# Patient Record
Sex: Female | Born: 1965 | Race: White | Hispanic: No | Marital: Married | Smoking: Never smoker
Health system: Southern US, Community
[De-identification: ages and names within clinical notes are randomized; demographics above are authoritative.]

## PROBLEM LIST (undated history)

## (undated) DIAGNOSIS — G479 Sleep disorder, unspecified: Secondary | ICD-10-CM

## (undated) DIAGNOSIS — H539 Unspecified visual disturbance: Secondary | ICD-10-CM

## (undated) DIAGNOSIS — K219 Gastro-esophageal reflux disease without esophagitis: Secondary | ICD-10-CM

## (undated) DIAGNOSIS — O903 Peripartum cardiomyopathy: Secondary | ICD-10-CM

## (undated) DIAGNOSIS — O1003 Pre-existing essential hypertension complicating the puerperium: Secondary | ICD-10-CM

## (undated) DIAGNOSIS — R002 Palpitations: Secondary | ICD-10-CM

## (undated) DIAGNOSIS — I341 Nonrheumatic mitral (valve) prolapse: Secondary | ICD-10-CM

## (undated) DIAGNOSIS — R569 Unspecified convulsions: Secondary | ICD-10-CM

## (undated) HISTORY — DX: Peripartum cardiomyopathy: O90.3

## (undated) HISTORY — PX: BREAST EXCISIONAL BIOPSY: SUR124

## (undated) HISTORY — DX: Unspecified visual disturbance: H53.9

## (undated) HISTORY — PX: TUBAL LIGATION: SHX77

## (undated) HISTORY — DX: Sleep disorder, unspecified: G47.9

## (undated) HISTORY — DX: Palpitations: R00.2

## (undated) HISTORY — DX: Gastro-esophageal reflux disease without esophagitis: K21.9

## (undated) HISTORY — DX: Pre-existing essential hypertension complicating the puerperium: O10.03

## (undated) HISTORY — DX: Unspecified convulsions: R56.9

## (undated) HISTORY — PX: OTHER SURGICAL HISTORY: SHX169

## (undated) HISTORY — PX: DILATION AND CURETTAGE OF UTERUS: SHX78

---

## 1996-12-10 HISTORY — PX: APPENDECTOMY: SHX54

## 1998-09-15 ENCOUNTER — Ambulatory Visit (HOSPITAL_COMMUNITY): Admission: RE | Admit: 1998-09-15 | Discharge: 1998-09-15 | Payer: Self-pay | Admitting: *Deleted

## 1999-05-05 ENCOUNTER — Inpatient Hospital Stay (HOSPITAL_COMMUNITY): Admission: RE | Admit: 1999-05-05 | Discharge: 1999-05-06 | Payer: Self-pay | Admitting: Gynecology

## 1999-08-31 ENCOUNTER — Other Ambulatory Visit: Admission: RE | Admit: 1999-08-31 | Discharge: 1999-08-31 | Payer: Self-pay | Admitting: Obstetrics and Gynecology

## 1999-12-11 DIAGNOSIS — O903 Peripartum cardiomyopathy: Secondary | ICD-10-CM

## 1999-12-11 DIAGNOSIS — H539 Unspecified visual disturbance: Secondary | ICD-10-CM

## 1999-12-11 HISTORY — DX: Unspecified visual disturbance: H53.9

## 1999-12-11 HISTORY — DX: Peripartum cardiomyopathy: O90.3

## 1999-12-18 ENCOUNTER — Ambulatory Visit (HOSPITAL_COMMUNITY): Admission: RE | Admit: 1999-12-18 | Discharge: 1999-12-18 | Payer: Self-pay | Admitting: *Deleted

## 2000-01-29 ENCOUNTER — Inpatient Hospital Stay (HOSPITAL_COMMUNITY): Admission: AD | Admit: 2000-01-29 | Discharge: 2000-01-29 | Payer: Self-pay | Admitting: Obstetrics and Gynecology

## 2000-01-29 ENCOUNTER — Encounter: Payer: Self-pay | Admitting: Obstetrics and Gynecology

## 2000-03-01 ENCOUNTER — Inpatient Hospital Stay (HOSPITAL_COMMUNITY): Admission: AD | Admit: 2000-03-01 | Discharge: 2000-03-03 | Payer: Self-pay | Admitting: *Deleted

## 2000-03-06 ENCOUNTER — Inpatient Hospital Stay (HOSPITAL_COMMUNITY): Admission: AD | Admit: 2000-03-06 | Discharge: 2000-03-09 | Payer: Self-pay | Admitting: Obstetrics and Gynecology

## 2000-03-06 ENCOUNTER — Encounter: Payer: Self-pay | Admitting: Obstetrics and Gynecology

## 2000-03-06 DIAGNOSIS — R569 Unspecified convulsions: Secondary | ICD-10-CM

## 2000-03-06 HISTORY — DX: Unspecified convulsions: R56.9

## 2000-03-07 ENCOUNTER — Encounter: Payer: Self-pay | Admitting: Obstetrics and Gynecology

## 2000-03-10 ENCOUNTER — Encounter: Payer: Self-pay | Admitting: *Deleted

## 2000-03-10 ENCOUNTER — Inpatient Hospital Stay (HOSPITAL_COMMUNITY): Admission: AD | Admit: 2000-03-10 | Discharge: 2000-03-12 | Payer: Self-pay | Admitting: *Deleted

## 2000-03-11 ENCOUNTER — Encounter: Payer: Self-pay | Admitting: *Deleted

## 2000-03-12 ENCOUNTER — Encounter: Payer: Self-pay | Admitting: Obstetrics and Gynecology

## 2000-03-17 ENCOUNTER — Encounter: Admission: RE | Admit: 2000-03-17 | Discharge: 2000-06-15 | Payer: Self-pay | Admitting: Obstetrics and Gynecology

## 2000-04-15 ENCOUNTER — Other Ambulatory Visit: Admission: RE | Admit: 2000-04-15 | Discharge: 2000-04-15 | Payer: Self-pay | Admitting: Obstetrics and Gynecology

## 2000-06-16 ENCOUNTER — Encounter: Admission: RE | Admit: 2000-06-16 | Discharge: 2000-07-09 | Payer: Self-pay | Admitting: Obstetrics and Gynecology

## 2001-06-05 ENCOUNTER — Other Ambulatory Visit: Admission: RE | Admit: 2001-06-05 | Discharge: 2001-06-05 | Payer: Self-pay | Admitting: Obstetrics and Gynecology

## 2001-11-05 ENCOUNTER — Encounter: Payer: Self-pay | Admitting: Obstetrics and Gynecology

## 2001-11-05 ENCOUNTER — Encounter: Admission: RE | Admit: 2001-11-05 | Discharge: 2001-11-05 | Payer: Self-pay | Admitting: Obstetrics and Gynecology

## 2002-06-24 ENCOUNTER — Other Ambulatory Visit: Admission: RE | Admit: 2002-06-24 | Discharge: 2002-06-24 | Payer: Self-pay | Admitting: Obstetrics and Gynecology

## 2003-06-25 ENCOUNTER — Emergency Department (HOSPITAL_COMMUNITY): Admission: EM | Admit: 2003-06-25 | Discharge: 2003-06-25 | Payer: Self-pay | Admitting: Emergency Medicine

## 2003-07-21 ENCOUNTER — Other Ambulatory Visit: Admission: RE | Admit: 2003-07-21 | Discharge: 2003-07-21 | Payer: Self-pay | Admitting: Obstetrics and Gynecology

## 2004-08-10 ENCOUNTER — Other Ambulatory Visit: Admission: RE | Admit: 2004-08-10 | Discharge: 2004-08-10 | Payer: Self-pay | Admitting: Obstetrics and Gynecology

## 2005-01-30 ENCOUNTER — Encounter: Admission: RE | Admit: 2005-01-30 | Discharge: 2005-01-30 | Payer: Self-pay | Admitting: Obstetrics and Gynecology

## 2005-02-09 ENCOUNTER — Encounter: Admission: RE | Admit: 2005-02-09 | Discharge: 2005-02-09 | Payer: Self-pay | Admitting: Obstetrics and Gynecology

## 2005-04-27 ENCOUNTER — Encounter: Admission: RE | Admit: 2005-04-27 | Discharge: 2005-04-27 | Payer: Self-pay | Admitting: Obstetrics and Gynecology

## 2005-09-20 ENCOUNTER — Other Ambulatory Visit: Admission: RE | Admit: 2005-09-20 | Discharge: 2005-09-20 | Payer: Self-pay | Admitting: Obstetrics and Gynecology

## 2005-10-26 ENCOUNTER — Encounter: Admission: RE | Admit: 2005-10-26 | Discharge: 2005-10-26 | Payer: Self-pay | Admitting: Obstetrics and Gynecology

## 2006-09-30 HISTORY — PX: NM MYOCAR PERF EJECTION FRACTION: HXRAD630

## 2006-09-30 HISTORY — PX: US ECHOCARDIOGRAPHY: HXRAD669

## 2006-11-18 ENCOUNTER — Encounter: Admission: RE | Admit: 2006-11-18 | Discharge: 2006-11-18 | Payer: Self-pay | Admitting: Obstetrics and Gynecology

## 2006-12-04 ENCOUNTER — Encounter: Admission: RE | Admit: 2006-12-04 | Discharge: 2006-12-04 | Payer: Self-pay | Admitting: Obstetrics and Gynecology

## 2007-11-20 ENCOUNTER — Encounter: Admission: RE | Admit: 2007-11-20 | Discharge: 2007-11-20 | Payer: Self-pay | Admitting: Obstetrics and Gynecology

## 2008-11-29 ENCOUNTER — Encounter: Admission: RE | Admit: 2008-11-29 | Discharge: 2008-11-29 | Payer: Self-pay | Admitting: Obstetrics and Gynecology

## 2009-01-28 ENCOUNTER — Encounter: Admission: RE | Admit: 2009-01-28 | Discharge: 2009-01-28 | Payer: Self-pay | Admitting: Obstetrics and Gynecology

## 2009-02-01 ENCOUNTER — Encounter: Admission: RE | Admit: 2009-02-01 | Discharge: 2009-02-01 | Payer: Self-pay | Admitting: Obstetrics and Gynecology

## 2009-02-18 ENCOUNTER — Encounter: Admission: RE | Admit: 2009-02-18 | Discharge: 2009-02-18 | Payer: Self-pay | Admitting: Obstetrics and Gynecology

## 2009-12-05 ENCOUNTER — Encounter: Admission: RE | Admit: 2009-12-05 | Discharge: 2009-12-05 | Payer: Self-pay | Admitting: Obstetrics and Gynecology

## 2010-12-06 ENCOUNTER — Encounter
Admission: RE | Admit: 2010-12-06 | Discharge: 2010-12-06 | Payer: Self-pay | Source: Home / Self Care | Attending: Obstetrics and Gynecology | Admitting: Obstetrics and Gynecology

## 2010-12-14 ENCOUNTER — Encounter
Admission: RE | Admit: 2010-12-14 | Discharge: 2010-12-14 | Payer: Self-pay | Source: Home / Self Care | Attending: Obstetrics and Gynecology | Admitting: Obstetrics and Gynecology

## 2011-04-27 NOTE — Discharge Summary (Signed)
[facility]  Patient:    Kathryn Horton, HEADINGS                     MRN: [id number] Adm. Date:  [id number] Disc. Date: [id number] Attending:  Donne Hazel                           Discharge Summary  HISTORY OF PRESENT ILLNESS:   The patient is a 45 year old, gravida 3, para 3, status post vaginal delivery eight days prior to this admission admitted with a  chief complaint of shortness of breath.  A chest x-ray showed mild pulmonary edema. For further details, please see the written history and physical note.  HOSPITAL COURSE:              The patient was diagnosed with pulmonary edema as  indicated on chest x-ray and mild mitral and pulmonic regurgitation by echocardiogram the week previously.  The patient was watched in the adult intensive care unit and pulmonary edema was treated with Lasix diuresis.  She was seen in  consultation by cardiologist from [facility] and then subsequently by Madaline Savage, M.D.  Madaline Savage, M.D. felt that the patient could be discharged on March 12, 2000, because her lungs were clear. she was discharged home on Verapamil SR 180 mg daily to follow up with Yolanda Bonine, P.A. in two to three weeks and to call with any problems.  The patient was subsequently discharged home and given routine postpartum instructions. DD:  04/26/00 TD:  04/28/00 Job: [id number]

## 2011-04-27 NOTE — Discharge Summary (Signed)
[facility]  Patient:    Kathryn Horton, Kathryn Horton                     MRN: [id number] Adm. Date:  [id number] Disc. Date: [id number] Attending:  Donne Hazel                           Discharge Summary  DISCHARGE DIAGNOSES:          1. Postpartum preeclampsia.                               2. Postpartum pulmonary edema.  HISTORY OF PRESENT ILLNESS:   For summary of the history and physical exam, please see admission H&P for details.                                Briefly, a 45 year old G3 P3 delivered four days prior to admission.  During that time, her blood pressure in the range of 130/80 maximum; platelet count was 102,000 and then 113,000 postpartum.  She presented the evening of admission complaining of right upper quadrant pain, some shortness of breath, occipital headache, and mild cough.  Initial evaluation revealed stable  vital signs, but BP was in the 150s-170s systolic over 98-109 diastolic range.  Lower extremity edema of 1+ was noted.  HOSPITAL COURSE:              On admission, ABGs on room air revealed pH 7.4, O2 61, CO2 was 28.  Chest x-ray showed interstitial edema.  The cardiac silhouette was not enlarged.  Initial impression was pulmonary edema with postpartum preeclampsia. Her urine was negative for protein but SGOT and PT were slightly elevated at 59  and 56, respectively.  She was started on magnesium sulfate, was given Lasix for diuresis.  The following a.m., BP was 140/70 with a 98% saturation on nasal cannula O2.  SGOT was 53, SGPT repeated was stable at 56.  Magnesium level was 5.                                Spiral CT was done along with echocardiogram, showed bilateral pleural effusions, pulmonary edema, no evidence for pulmonary embolus. Cardiac echo showed normal aortic mitral and tricuspid valves, mild mitral regurgitation, normal left ventricular, and no pericardial effusion was noted.   By March 30,  her room air O2 saturations were 98-99%.  Her magnesium sulfate was discontinued along with her oxygen and her Foley catheter.  On the following a.m., March 31, she felt much improved, was breathing without difficulty, and maintaining a good O2 saturation on room air.  Lungs were clear at that point.  Decision was made to proceed with discharge, with follow-up in our office and repeat lab studies within two to three days.  The BP on discharge was 148/86.  OTHER LABORATORY DATA:        CBC on admission - WBC 9.3, hemoglobin 12.4. Urine was negative for protein.  Platelet count 214,000.  DISPOSITION:  MEDICATIONS:                  Patient will resume her prenatal vitamins.  FOLLOW-UP:  Will return to our office in two to three days for follow-up lab studies.  INSTRUCTIONS:                 Was advised to report any shortness of breath, chest discomfort, persistent cough.  Other routine postpartum instructions were reviewed with her.  CONDITION:                    Good.  ACTIVITY:                     Gradually increase. DD:  04/02/00 TD:  04/03/00 Job: [id number]

## 2011-11-12 ENCOUNTER — Other Ambulatory Visit: Payer: Self-pay | Admitting: Obstetrics and Gynecology

## 2011-11-12 DIAGNOSIS — Z1231 Encounter for screening mammogram for malignant neoplasm of breast: Secondary | ICD-10-CM

## 2011-12-10 ENCOUNTER — Ambulatory Visit
Admission: RE | Admit: 2011-12-10 | Discharge: 2011-12-10 | Disposition: A | Discharge status: Home / Self Care | Payer: BC Managed Care – PPO | Source: Ambulatory Visit | Attending: Obstetrics and Gynecology | Admitting: Obstetrics and Gynecology

## 2011-12-10 DIAGNOSIS — Z1231 Encounter for screening mammogram for malignant neoplasm of breast: Secondary | ICD-10-CM

## 2012-06-19 HISTORY — PX: OTHER SURGICAL HISTORY: SHX169

## 2012-06-24 HISTORY — PX: US ECHOCARDIOGRAPHY: HXRAD669

## 2012-06-26 HISTORY — PX: OTHER SURGICAL HISTORY: SHX169

## 2012-11-05 ENCOUNTER — Other Ambulatory Visit: Payer: Self-pay | Admitting: Obstetrics and Gynecology

## 2012-11-05 DIAGNOSIS — Z1231 Encounter for screening mammogram for malignant neoplasm of breast: Secondary | ICD-10-CM

## 2012-12-11 ENCOUNTER — Ambulatory Visit
Admission: RE | Admit: 2012-12-11 | Discharge: 2012-12-11 | Disposition: A | Discharge status: Home / Self Care | Payer: BC Managed Care – PPO | Source: Ambulatory Visit | Attending: Obstetrics and Gynecology | Admitting: Obstetrics and Gynecology

## 2012-12-11 DIAGNOSIS — Z1231 Encounter for screening mammogram for malignant neoplasm of breast: Secondary | ICD-10-CM

## 2013-06-17 ENCOUNTER — Other Ambulatory Visit: Payer: Self-pay | Admitting: Obstetrics and Gynecology

## 2013-11-12 ENCOUNTER — Other Ambulatory Visit: Payer: Self-pay

## 2013-11-12 DIAGNOSIS — Z1231 Encounter for screening mammogram for malignant neoplasm of breast: Secondary | ICD-10-CM

## 2013-12-15 ENCOUNTER — Ambulatory Visit
Admission: RE | Admit: 2013-12-15 | Discharge: 2013-12-15 | Disposition: A | Discharge status: Home / Self Care | Payer: BC Managed Care – PPO | Source: Ambulatory Visit

## 2013-12-15 DIAGNOSIS — Z1231 Encounter for screening mammogram for malignant neoplasm of breast: Secondary | ICD-10-CM

## 2014-01-19 ENCOUNTER — Other Ambulatory Visit: Payer: Self-pay | Admitting: Dermatology

## 2014-02-11 ENCOUNTER — Ambulatory Visit (INDEPENDENT_AMBULATORY_CARE_PROVIDER_SITE_OTHER): Payer: BC Managed Care – PPO

## 2014-02-11 ENCOUNTER — Ambulatory Visit (INDEPENDENT_AMBULATORY_CARE_PROVIDER_SITE_OTHER): Payer: BC Managed Care – PPO | Admitting: Family Medicine

## 2014-02-11 VITALS — BP 116/74 | HR 64 | Temp 98.9°F | Wt 131.0 lb

## 2014-02-11 DIAGNOSIS — M25571 Pain in right ankle and joints of right foot: Secondary | ICD-10-CM

## 2014-02-11 DIAGNOSIS — M25579 Pain in unspecified ankle and joints of unspecified foot: Secondary | ICD-10-CM

## 2014-02-11 MED ORDER — NAPROXEN 500 MG PO TABS: 500.0000 mg | ORAL_TABLET | Freq: Two times a day (BID) | ORAL | Status: DC

## 2014-02-11 NOTE — Progress Notes (Signed)
   Subjective:    Patient ID: Kathryn Horton, female    DOB: 12/13/1965, 48 y.o.   MRN: [id number]  HPI This 48 y.o. female presents for evaluation of right ankle pain and discomfort.  She is a runner And last ran a week and a half ago.  She started having pain on her right lateral malleolus 2 days ago.  She has not had any injury that she can recall.   Review of Systems C/o right ankle pain No chest pain, SOB, HA, dizziness, vision change, N/V, diarrhea, constipation, dysuria, urinary urgency or frequency, myalgias, arthralgias or rash.     Objective:   Physical Exam  Vital signs noted  Well developed well nourished female.  HEENT - Head atraumatic Normocephalic Respiratory - Lungs CTA bilateral Cardiac - RRR S1 and S2 without murmur MS - Swelling, erythema, and tenderness right lateral malleolus.          Gait WNL      Assessment & Plan:

## 2014-02-23 ENCOUNTER — Other Ambulatory Visit: Payer: Self-pay

## 2014-02-23 MED ORDER — PANTOPRAZOLE SODIUM 40 MG PO TBEC: 40.0000 mg | DELAYED_RELEASE_TABLET | Freq: Every day | ORAL | Status: DC

## 2014-02-23 NOTE — Telephone Encounter (Signed)
Rx was sent to pharmacy electronically. 

## 2014-06-02 ENCOUNTER — Other Ambulatory Visit: Payer: Self-pay | Admitting: Cardiovascular Disease

## 2014-06-09 ENCOUNTER — Other Ambulatory Visit: Payer: Self-pay | Admitting: *Deleted

## 2014-06-10 NOTE — Telephone Encounter (Signed)
Rx was sent to pharmacy electronically. Patient needs office visit for future refills. Last office visit - 04/14/13 with Dr Jonette Eva Weintraub.

## 2014-06-14 ENCOUNTER — Other Ambulatory Visit: Payer: Self-pay | Admitting: Cardiovascular Disease

## 2014-06-14 NOTE — Telephone Encounter (Signed)
Refilled 06/10/14 - #15 with no refills. Patient needs appointment.

## 2014-06-30 ENCOUNTER — Other Ambulatory Visit: Payer: Self-pay | Admitting: Cardiovascular Disease

## 2014-06-30 NOTE — Telephone Encounter (Signed)
Patient must keep appointment on 08/19/14 with Dr Herbie BaltimoreHarding for ANY future refills.

## 2014-07-12 ENCOUNTER — Telehealth: Payer: Self-pay | Admitting: *Deleted

## 2014-07-12 ENCOUNTER — Telehealth: Payer: Self-pay | Admitting: Cardiology

## 2014-07-12 NOTE — Telephone Encounter (Signed)
Returning your call. °

## 2014-07-12 NOTE — Telephone Encounter (Signed)
Patient P.A. Needs to be filed out for Protonix. Omeprazole did not work at all according to patient. I will fill out P.A. Now .

## 2014-07-12 NOTE — Telephone Encounter (Signed)
Questions regarding Prior Auth for Protonix. Ask patient to please call me back to get clarification. Patient was put on Nexium 11/13 due to insurance not covering, then [facility]

## 2014-07-13 ENCOUNTER — Telehealth: Payer: Self-pay | Admitting: *Deleted

## 2014-07-13 NOTE — Telephone Encounter (Signed)
P.A. sent in for Protonix on 07/12/14. Approval e-mail received this morning

## 2014-08-11 ENCOUNTER — Other Ambulatory Visit: Payer: Self-pay | Admitting: Cardiovascular Disease

## 2014-08-11 NOTE — Telephone Encounter (Signed)
Rx was sent to pharmacy electronically. OV 08/19/14

## 2014-08-19 ENCOUNTER — Ambulatory Visit: Payer: BC Managed Care – PPO | Admitting: Cardiology

## 2014-09-17 ENCOUNTER — Other Ambulatory Visit: Payer: Self-pay | Admitting: Cardiovascular Disease

## 2014-09-20 NOTE — Telephone Encounter (Signed)
Rx was sent to pharmacy electronically. OV 10/19 

## 2014-09-23 ENCOUNTER — Encounter: Payer: Self-pay | Admitting: *Deleted

## 2014-09-27 ENCOUNTER — Ambulatory Visit (INDEPENDENT_AMBULATORY_CARE_PROVIDER_SITE_OTHER): Payer: BC Managed Care – PPO | Admitting: Cardiology

## 2014-09-27 ENCOUNTER — Encounter: Payer: Self-pay | Admitting: Cardiology

## 2014-09-27 VITALS — BP 140/98 | HR 56 | Ht 64.0 in | Wt 130.6 lb

## 2014-09-27 DIAGNOSIS — O903 Peripartum cardiomyopathy: Secondary | ICD-10-CM

## 2014-09-27 DIAGNOSIS — R002 Palpitations: Secondary | ICD-10-CM | POA: Insufficient documentation

## 2014-09-27 DIAGNOSIS — I10 Essential (primary) hypertension: Secondary | ICD-10-CM | POA: Insufficient documentation

## 2014-09-27 DIAGNOSIS — R030 Elevated blood-pressure reading, without diagnosis of hypertension: Secondary | ICD-10-CM

## 2014-09-27 DIAGNOSIS — O1003 Pre-existing essential hypertension complicating the puerperium: Secondary | ICD-10-CM

## 2014-09-27 NOTE — Progress Notes (Signed)
PCP: Rudi HeapMOORE, DONALD, MD  Clinic Note: Chief Complaint  Patient presents with  . Follow-up    5163yr; no complaints other than having vision issues on occasion.    HPI: Kathryn Horton is a 48 y.o. female with a PMH below who presents today for delayed one year followup. She is a former patient of Dr. Susa Griffinsichard Weintraub last seen in May of 2014. She has a history of postpartum hypertension/eclampsia postpartum cardiomyopathy when the birth of her now 48 year old son. Immediately post partum she had mitral regurgitation that has gradually improved. Her most recent echocardiographic evaluations have shown recovery of left ventricular function as well as mitral valve function. She's also had some palpitations have improved over the last few years as well. As she is a new patient to me, personally reviewed her echocardiogram and Myoview as well as other medical records.  Past Medical History  Diagnosis Date  . Palpitations 06/26/2012-07/09/2012    WORE MOINTOR- SHOWED NORMAL  SINUS - SINUS TACHYCARDIA 78 TO 143  . Hypertension in pregnancy, essential, postpartum condition     pregnancy induced - Post Partum Ecclampsia  . Seizures 03/06/2000    on first visit to hospital after baby  . Visual disorder 2001    during hih blood pressure with pregnancy  . GERD (gastroesophageal reflux disease)   . Sleep difficulties   . Postpartum cardiomyopathy 2001    Related to Post-partum Ecclampsia; Most Recent Echo EF greater than 55%, normal LV size and function. Mild MR with no MVP (previouly had functional MR)    Prior Cardiac Evaluation and Past Surgical History: Past Surgical History  Procedure Laterality Date  . [place] echocardiography  06/24/2012    EF =>55%, NORMAL  LEFT VENTRICULAR WALL, NORMAL LEFT ATRIAL SIZE,mild mitral regurg, mild tricuspid regurg.  . [place] echocardiography  09/30/2006    EF=>55% LEFT VENTRICULAR WALL NORMAL, mild mitral regurgitation, mild tricuspid regurg.  . Treadmill exercise  stress test  06/26/2012    negative ti adequate Bruce protocol- exercised for 11 min reaching 15.4 METS ACHEIVING APEAK HRAET RATE OF 185 REPRESENTING A 105% OF HER AGE-PREDICTED MAXIUM HEART RATE, RECVERED IN 6 MINUTES WITH RESTING HEART RATE OF 97  . Nm myocar perf ejection fraction  09/30/2006    EF 81% , GLOBAL LEFT VENTRICULAR SYSTOLIC FX NORMAL; EXERCISE CAPACITY 8 METS.  . Right lower venous doppler  06/19/2012    NO EVIDENCE OF THROMBUS  . Bilateral lower arterial doppler  07/11/213    NORMAL ABI'S 1.1  . Appendectomy  1998    Interval History: He presented today very stable without any major complaints. She is in a little rushed getting her into her blood pressure was low but higher when she first came in. Her blood pressure my recheck was 130/90. Denies any chest tightness/pressure or dyspnea with rest or exertion. No PND, orthopnea or edema. No rapid or irregular heartbeat/palpitations in the past year or so she gets stressed. She denies any syncope/near syncope, orNo TIA/amaurosis fugax symptoms. No claudication.  She last saw Dr. Alanda AmassWeintraub she was having some sharp burning sensations in her chest that he started her on narcotics 4. Since then she has been doing great no further symptoms. ROS: A comprehensive was performed. Review of Systems  HENT: Negative for congestion and nosebleeds.   Respiratory: Negative for cough, shortness of breath and wheezing.   Cardiovascular: Negative for claudication.  Gastrointestinal: Negative for constipation, blood in stool and melena.  Genitourinary: Negative for hematuria.  Musculoskeletal:  Negative for joint pain and myalgias.  Neurological: Negative for dizziness, sensory change, speech change, focal weakness, seizures and loss of consciousness.  Endo/Heme/Allergies: Does not bruise/bleed easily.  Psychiatric/Behavioral: Negative for depression. The patient is not nervous/anxious.   All other systems reviewed and are  negative.   Current Outpatient Prescriptions on File Prior to Visit  Medication Sig Dispense Refill  . pantoprazole (PROTONIX) 40 MG tablet Take 1 tablet (40 mg total) by mouth daily. Appointment 10/19.  30 tablet  0  . Progesterone Micronized (PROGESTERONE PO) Take by mouth. Every 3 months ,prn      . zolpidem (AMBIEN) 5 MG tablet Take 5 mg by mouth at bedtime as needed for sleep.       No current facility-administered medications on file prior to visit.   No Known Allergies  History   Social History Narrative   She is a married (remarried) mother of 3 children 2 from her first marriage they're aged 48 and 6922. She has a 48 year old son from her second marriage. 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   Is very active and works full-time As well as exercises.   She never smoked. Social alcohol.   family history includes Arrhythmia in her mother; Hypertension in her maternal grandmother and mother; Stroke in her maternal grandmother.  Wt Readings from Last 3 Encounters:  09/27/14 130 lb 9.6 oz (59.24 kg)  02/11/14 131 lb (59.421 kg)    PHYSICAL EXAM BP 140/98  Pulse 56  Ht 5\' 4"  (1.626 m)  Wt 130 lb 9.6 oz (59.24 kg)  BMI 22.41 kg/m2 General appearance: alert, cooperative, appears stated age, no distress and otherwise healthy-appearing. Neck: no adenopathy, no carotid bruit and no JVD Lungs: clear to auscultation bilaterally, normal percussion bilaterally and non-labored Heart: regular rate and rhythm, S1 &S2 normal, very soft ~1/6 and apex and, no click, rub or gallop; nondisplaced PMI Abdomen: soft, non-tender; bowel sounds normal; no masses,  no organomegaly; Extremities: extremities normal, atraumatic, no cyanosis, or edema Pulses: 2+ and symmetric; Skin: normal and mobility and turgor normal  Neurologic: Mental status: Alert, oriented, thought content appropriate Cranial nerves: normal  (II-XII grossly intact)    Adult ECG Report  Rate: 56 ;  Rhythm: sinus bradycardia  Narrative Interpretation: Otherwise normal EKG  Recent Labs:    None available  ASSESSMENT / PLAN: 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 No heart failure symptoms. She is no longer on any medications has been stable for several years. Continued monitoring annually.  Heart palpitations Resolved. Not on medications.  Borderline hypertension Her blood pressure was mildly elevated upon arrival today, however on my recheck it was pretty normal. She was just a little stressed having rushed in. For now to continue monitor as I think this is probably just anxiety related mild elevation of blood pressures. With no further heart failure symptoms and improved EF she does not require cardiomyopathy related medications.     Orders Placed This Encounter  Procedures  . EKG 12-Lead   No orders of the defined types were placed in this encounter.    Followup: One year   Marykay LexHARDING,DAVID W, M.D., M.S. Interventional Cardiologist   Pager # [phone]

## 2014-09-27 NOTE — Assessment & Plan Note (Signed)
Most recent echocardiogram in 2013 showed essentially normal EF with minimal MR. No heart failure symptoms. She is no longer on any medications has been stable for several years. Continued monitoring annually.

## 2014-09-27 NOTE — Assessment & Plan Note (Signed)
Resolved. Not on medications.

## 2014-09-27 NOTE — Assessment & Plan Note (Signed)
Her blood pressure was mildly elevated upon arrival today, however on my recheck it was pretty normal. She was just a little stressed having rushed in. For now to continue monitor as I think this is probably just anxiety related mild elevation of blood pressures. With no further heart failure symptoms and improved EF she does not require cardiomyopathy related medications.

## 2014-09-27 NOTE — Patient Instructions (Signed)
Your physician recommends that you schedule a follow-up appointment in: one year with Dr. Harding  

## 2014-10-11 ENCOUNTER — Telehealth: Payer: Self-pay | Admitting: Cardiology

## 2014-10-11 ENCOUNTER — Telehealth: Payer: Self-pay | Admitting: *Deleted

## 2014-10-11 NOTE — Telephone Encounter (Signed)
Please call,blood pressure is extremely high for her. It is 151/90, this was just a few minutes ago.Since she last saw the doctor its been running high more than usual.

## 2014-10-11 NOTE — Telephone Encounter (Signed)
Wrong doctor °

## 2014-10-11 NOTE — Telephone Encounter (Signed)
LMTCB

## 2014-10-11 NOTE — Telephone Encounter (Signed)
Lets try low dose ACE-I - Lisinopril ~2.5 mg daily.  Diastolic pressures are a bit high.  Marykay LexHARDING,Tamarius Rosenfield W, MD

## 2014-10-11 NOTE — Telephone Encounter (Signed)
Pt states she checked her BP these days because she had a headache and just didn't feel like herself. These are the only days she has checked her BP since office visit with Dr Herbie BaltimoreHarding 09/27/14.  Her heart rate today was 85. She states in the past her BP has been in the 117/70 range.  I will forward to Dr Herbie BaltimoreHarding for review and recommendations.

## 2014-10-11 NOTE — Telephone Encounter (Signed)
BP readings since office visit with Dr Herbie BaltimoreHarding: 09/28/14 136/96 10/09/14 135/98 10/10/14 135/85 10/11/14 151/90

## 2014-10-12 MED ORDER — LISINOPRIL 2.5 MG PO TABS: 2.5000 mg | ORAL_TABLET | Freq: Every day | ORAL | Status: DC

## 2014-10-12 NOTE — Telephone Encounter (Signed)
Left message to call back  

## 2014-10-12 NOTE — Telephone Encounter (Signed)
Spoke to patient  instruction given lisinopril 2.5 mg daily  patient verbalized understanding. Sent to pharmacy  #30 x 6

## 2014-10-26 ENCOUNTER — Other Ambulatory Visit: Payer: Self-pay | Admitting: Cardiology

## 2014-10-26 NOTE — Telephone Encounter (Signed)
Rx was sent to pharmacy electronically. 

## 2014-11-15 ENCOUNTER — Other Ambulatory Visit: Payer: Self-pay

## 2014-11-15 DIAGNOSIS — Z1231 Encounter for screening mammogram for malignant neoplasm of breast: Secondary | ICD-10-CM

## 2014-12-17 ENCOUNTER — Other Ambulatory Visit: Payer: Self-pay

## 2014-12-17 ENCOUNTER — Ambulatory Visit
Admission: RE | Admit: 2014-12-17 | Discharge: 2014-12-17 | Disposition: A | Discharge status: Home / Self Care | Payer: BC Managed Care – PPO | Source: Ambulatory Visit

## 2014-12-17 ENCOUNTER — Ambulatory Visit: Payer: BC Managed Care – PPO

## 2014-12-17 DIAGNOSIS — Z1231 Encounter for screening mammogram for malignant neoplasm of breast: Secondary | ICD-10-CM

## 2014-12-20 ENCOUNTER — Other Ambulatory Visit: Payer: Self-pay | Admitting: Obstetrics and Gynecology

## 2014-12-20 DIAGNOSIS — R928 Other abnormal and inconclusive findings on diagnostic imaging of breast: Secondary | ICD-10-CM

## 2014-12-24 ENCOUNTER — Ambulatory Visit
Admission: RE | Admit: 2014-12-24 | Discharge: 2014-12-24 | Disposition: A | Discharge status: Home / Self Care | Payer: BC Managed Care – PPO | Source: Ambulatory Visit | Attending: Obstetrics and Gynecology | Admitting: Obstetrics and Gynecology

## 2014-12-24 ENCOUNTER — Other Ambulatory Visit: Payer: Self-pay | Admitting: Obstetrics and Gynecology

## 2014-12-24 DIAGNOSIS — R928 Other abnormal and inconclusive findings on diagnostic imaging of breast: Secondary | ICD-10-CM

## 2014-12-24 DIAGNOSIS — N6320 Unspecified lump in the left breast, unspecified quadrant: Secondary | ICD-10-CM

## 2015-03-17 ENCOUNTER — Ambulatory Visit (INDEPENDENT_AMBULATORY_CARE_PROVIDER_SITE_OTHER): Payer: BC Managed Care – PPO | Admitting: Physician Assistant

## 2015-03-17 ENCOUNTER — Encounter: Payer: Self-pay | Admitting: Physician Assistant

## 2015-03-17 VITALS — BP 110/68 | HR 69 | Temp 97.3°F | Ht 64.0 in | Wt 133.0 lb

## 2015-03-17 DIAGNOSIS — Z021 Encounter for pre-employment examination: Secondary | ICD-10-CM | POA: Diagnosis not present

## 2015-03-17 NOTE — Progress Notes (Signed)
   Subjective:    Patient ID: Kathryn Horton, female    DOB: 07/13/1966, 49 y.o.   MRN: [id number]  HPI 49 y/o female presents for pre-employment physical. Comorbidities include borderline htn and insomnia.     Review of Systems  All other systems reviewed and are negative.      Objective:   Physical Exam  Constitutional: She is oriented to person, place, and time.  Cardiovascular: Normal rate, regular rhythm and normal heart sounds.  Exam reveals no gallop and no friction rub.   No murmur heard. Pulmonary/Chest: Effort normal and breath sounds normal. No respiratory distress. She has no wheezes. She has no rales.  Musculoskeletal: Normal range of motion. She exhibits no edema or tenderness.  Neurological: She is alert and oriented to person, place, and time. Coordination normal.  Psychiatric: She has a normal mood and affect. Her behavior is normal. Judgment and thought content normal.  Nursing note and vitals reviewed.         Assessment & Plan:  1. Wellness pre employment physical: WNL, able to participate in normal activities with no restrictions

## 2015-03-29 ENCOUNTER — Ambulatory Visit (INDEPENDENT_AMBULATORY_CARE_PROVIDER_SITE_OTHER): Payer: BC Managed Care – PPO | Admitting: *Deleted

## 2015-03-29 DIAGNOSIS — Z111 Encounter for screening for respiratory tuberculosis: Secondary | ICD-10-CM

## 2015-03-29 NOTE — Patient Instructions (Signed)

## 2015-03-29 NOTE — Progress Notes (Signed)
TB skin test placed on left forearm and patient tolerated well. Patient will return on Thursday to have ppd skin test read.

## 2015-03-31 ENCOUNTER — Ambulatory Visit: Payer: BC Managed Care – PPO | Admitting: *Deleted

## 2015-03-31 LAB — TB SKIN TEST
Induration: 3 mm
TB SKIN TEST: NEGATIVE

## 2015-04-01 NOTE — Progress Notes (Signed)
Pt came in today to have PPD read. PPD wnl.

## 2015-05-26 ENCOUNTER — Other Ambulatory Visit: Payer: Self-pay | Admitting: Cardiology

## 2015-05-26 NOTE — Telephone Encounter (Signed)
Rx(s) sent to pharmacy electronically.  

## 2015-05-27 ENCOUNTER — Other Ambulatory Visit: Payer: Self-pay | Admitting: Obstetrics and Gynecology

## 2015-05-30 LAB — CYTOLOGY - PAP

## 2015-07-20 ENCOUNTER — Telehealth: Payer: Self-pay | Admitting: Cardiology

## 2015-07-20 NOTE — Telephone Encounter (Signed)
  Kathryn Horton is calling about a pre-cert for her Protonix , Her insurance company is requiring that her medication gets re certify every 90 days and the pharmacy states that they have faxed over the forms for the pre -cert        Will route to Burt Knack RN to see if she has the forms sent over by pharmacy for re-cert for Protonix

## 2015-07-20 NOTE — Telephone Encounter (Signed)
Kathryn Horton is calling about a pre-cert for her Protonix , Her insurance company is requiring that her medication gets re certify every 90 days and the pharmacy states that they have faxed over the forms for the pre -cert . Please call   Routed to K. Alvstad Pharmacist.  Returned call to patient.  VM that I will call her back when I hear back from pharmacist

## 2015-07-20 NOTE — Telephone Encounter (Signed)
Kathryn Horton is calling about a pre-cert for her Protonix , Her insurance company is requiring that her medication gets re certify every 90 days and the pharmacy states that they have faxed over the forms for the pre -cert . Please call  Thanks

## 2015-07-20 NOTE — Telephone Encounter (Signed)
This should go to the MDs nurse, I don't do prior auths/pre-certs

## 2015-08-22 ENCOUNTER — Encounter: Payer: Self-pay | Admitting: Family Medicine

## 2015-08-22 ENCOUNTER — Ambulatory Visit (INDEPENDENT_AMBULATORY_CARE_PROVIDER_SITE_OTHER): Payer: BC Managed Care – PPO | Admitting: Family Medicine

## 2015-08-22 VITALS — BP 119/80 | HR 71 | Temp 99.1°F | Ht 64.0 in | Wt 130.0 lb

## 2015-08-22 DIAGNOSIS — J208 Acute bronchitis due to other specified organisms: Secondary | ICD-10-CM | POA: Diagnosis not present

## 2015-08-22 NOTE — Progress Notes (Signed)
Subjective:  Patient ID: Kathryn Horton, female    DOB: [birth year]  Age: 49 y.o. MRN: [id number]  CC: URI   HPI Kathryn Horton presents for 4 days of intractable cough. It is nonproductive. It is not associated by shortness of breath or fever. There has been just a slight bit of swelling in the hands so that she cannot put on her wedding rings. 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 She has had severe episodes of heart failure in the past but that has been quite as sent for recent history. Today she is concerned that she may be having some return of her heart symptoms. She has some chest pain that she is says is at the level of her diaphragm up under the breasts. She does not have any central or precordial chest pain. The pain is a crampy ache. There has not been any upper respiratory congestion sore throat drainage or earache. Appetite is good activity level remains normal.  History Kathryn Horton has a past medical history of Palpitations (06/26/2012-07/09/2012); Hypertension in pregnancy, essential, postpartum condition; Seizures (03/06/2000); Visual disorder (2001); GERD (gastroesophageal reflux disease); Sleep difficulties; and Postpartum cardiomyopathy (2001).   She has past surgical history that includes US ECHOCARDIOGRAPHY (06/24/2012); US ECHOCARDIOGRAPHY (09/30/2006); TREADMILL EXERCISE STRESS TEST (06/26/2012); NM MYOCAR PERF EJECTION FRACTION (09/30/2006); RIGHT LOWER VENOUS DOPPLER (06/19/2012); BILATERAL LOWER ARTERIAL DOPPLER (07/11/213); and Appendectomy (1998).   Her family history includes Arrhythmia in her mother; Hypertension in her maternal grandmother and mother; Stroke in her maternal grandmother.She reports that she has never smoked. She does not have any smokeless tobacco history on file. She reports that she drinks alcohol. She reports that she does not use illicit drugs.  Outpatient Prescriptions Prior to Visit  Medication Sig Dispense Refill   . lisinopril (PRINIVIL,ZESTRIL) 2.5 MG tablet TAKE ONE TABLET BY MOUTH ONCE DAILY 30 tablet 6  . pantoprazole (PROTONIX) 40 MG tablet TAKE ONE TABLET BY MOUTH ONCE DAILY 30 tablet 10  . zolpidem (AMBIEN) 10 MG tablet     . Progesterone Micronized (PROGESTERONE PO) Take by mouth. Every 3 months ,prn     No facility-administered medications prior to visit.    ROS Review of Systems  Constitutional: Negative for fever, chills, diaphoresis, appetite change, fatigue and unexpected weight change.  HENT: Negative for congestion, ear pain, hearing loss, postnasal drip, rhinorrhea, sneezing, sore throat and trouble swallowing.   Eyes: Negative for pain.  Respiratory: Positive for cough. Negative for chest tightness and shortness of breath.   Cardiovascular: Negative for chest pain and palpitations.  Gastrointestinal: Negative for nausea, vomiting, abdominal pain, diarrhea and constipation.  Genitourinary: Negative for dysuria, frequency and menstrual problem.  Musculoskeletal: Negative for joint swelling and arthralgias.  Skin: Negative for rash.  Neurological: Negative for dizziness, weakness, numbness and headaches.  Psychiatric/Behavioral: Negative for dysphoric mood and agitation.    Objective:  BP 119/80 mmHg  Pulse 71  Temp(Src) 99.1 F (37.3 C) (Oral)  Ht 5\' 4"  (1.626 m)  Wt 130 lb (58.968 kg)  BMI 22.30 kg/m2  BP Readings from Last 3 Encounters:  08/22/15 119/80  03/17/15 110/68  09/27/14 140/98    Wt Readings from Last 3 Encounters:  08/22/15 130 lb (58.968 kg)  03/17/15 133 lb (60.328 kg)  09/27/14 130 lb 9.6 oz (59.24 kg)     Physical Exam  Constitutional: She is oriented to person, place, and time. She appears well-developed and well-nourished. No distress.  HENT:  Head:  Normocephalic and atraumatic.  Eyes: Conjunctivae are normal. Pupils are equal, round, and reactive to light.  Neck: Normal range of motion. Neck supple. No thyromegaly present.  Cardiovascular:  Normal rate, regular rhythm and normal heart sounds.   No murmur heard. Pulmonary/Chest: Effort normal and breath sounds normal. No respiratory distress. She has no wheezes. She has no rales.  Abdominal: Soft. Bowel sounds are normal. She exhibits no distension. There is no tenderness.  Musculoskeletal: Normal range of motion.  Lymphadenopathy:    She has no cervical adenopathy.  Neurological: She is alert and oriented to person, place, and time.  Skin: Skin is warm and dry.  Psychiatric: She has a normal mood and affect. Her behavior is normal. Judgment and thought content normal.    No results found for: HGBA1C  No results found for: WBC, HGB, HCT, PLT, GLUCOSE, CHOL, TRIG, HDL, LDLDIRECT, LDLCALC, ALT, AST, NA, K, CL, CREATININE, BUN, CO2, TSH, PSA, INR, GLUF, HGBA1C, MICROALBUR  Mm Digital Diagnostic Unilat L  12/24/2014   CLINICAL DATA:  Patient presents for additional views of the left breast as followup to a recent screening exam to evaluate possible distortion over the central mid to lower breast. When patient presents today, she also describes a new palpable abnormality over the 12 o'clock position of the left breast over the past week. The patient had a prior benign excisional biopsy over the inner lower left breast 1997.  EXAM: DIGITAL DIAGNOSTIC  left MAMMOGRAM  ULTRASOUND left BREAST  COMPARISON:  Previous exams.  ACR Breast Density Category c: The breast tissue is heterogeneously dense, which may obscure small masses.  FINDINGS: Spot compression images demonstrate no focal distortion over the central mid to lower left breast. Spot tangential image over patient's palpable abnormality at the 12 o'clock position demonstrates no focal abnormality.  On physical exam, I palpate no focal abnormality over the 12 o'clock position of the left breast.  Ultrasound is performed, showing no focal abnormality over the 12 o'clock position of the left breast to correspond to patient's palpable  abnormality.  IMPRESSION: No focal distortion over the lower central left breast. No focal abnormality to account for patient's palpable abnormality over the 12 o'clock position of the left breast.  RECOMMENDATION: Recommend continued management of patient's palpable abnormality on a clinical basis. Also recommend continued annual bilateral screening mammographic followup.  I have discussed the findings and recommendations with the patient. Results were also provided in writing at the conclusion of the visit. If applicable, a reminder letter will be sent to the patient regarding the next appointment.  BI-RADS CATEGORY  1: Negative.   Electronically Signed   By: Elberta Fortis M.D.   On: 12/24/2014 12:16   US Breast Ltd Uni Left Inc Axilla  12/24/2014   CLINICAL DATA:  Patient presents for additional views of the left breast as followup to a recent screening exam to evaluate possible distortion over the central mid to lower breast. When patient presents today, she also describes a new palpable abnormality over the 12 o'clock position of the left breast over the past week. The patient had a prior benign excisional biopsy over the inner lower left breast 1997.  EXAM: DIGITAL DIAGNOSTIC  left MAMMOGRAM  ULTRASOUND left BREAST  COMPARISON:  Previous exams.  ACR Breast Density Category c: The breast tissue is heterogeneously dense, which may obscure small masses.  FINDINGS: Spot compression images demonstrate no focal distortion over the central mid to lower left breast. Spot tangential image over patient's  palpable abnormality at the 12 o'clock position demonstrates no focal abnormality.  On physical exam, I palpate no focal abnormality over the 12 o'clock position of the left breast.  Ultrasound is performed, showing no focal abnormality over the 12 o'clock position of the left breast to correspond to patient's palpable abnormality.  IMPRESSION: No focal distortion over the lower central left breast. No focal  abnormality to account for patient's palpable abnormality over the 12 o'clock position of the left breast.  RECOMMENDATION: Recommend continued management of patient's palpable abnormality on a clinical basis. Also recommend continued annual bilateral screening mammographic followup.  I have discussed the findings and recommendations with the patient. Results were also provided in writing at the conclusion of the visit. If applicable, a reminder letter will be sent to the patient regarding the next appointment.  BI-RADS CATEGORY  1: Negative.   Electronically Signed   By: Elberta Fortis M.D.   On: 12/24/2014 12:16    Assessment & Plan:   Josaphine was seen today for uri.  Diagnoses and all orders for this visit:  Acute viral bronchitis  I have discontinued Kathryn Horton's Progesterone Micronized (PROGESTERONE PO). I am also having her maintain her pantoprazole, zolpidem, and lisinopril.  No orders of the defined types were placed in this encounter.   Patient will monitor the cough. She does not want medication if it can be avoided. She will follow-up if she become short of breath, the cough becomes productive, fever develops or cardiac symptoms such as chest pain lassitude and edema occur.  Follow-up: Return if symptoms worsen or fail to improve.  Mechele Claude, M.D.

## 2015-08-22 NOTE — Addendum Note (Signed)
Addended by: Mechele Claude on: 08/22/2015 07:38 PM   Modules accepted: Kipp Brood

## 2015-09-14 ENCOUNTER — Telehealth: Payer: Self-pay | Admitting: Cardiology

## 2015-09-14 NOTE — Telephone Encounter (Signed)
Pt called in stating that she still has not received her Pantoprazole and she isn't sure what the issue is. She called in back on 8/10 about a pre cert/ prior authorization but I'm sure if it was addressed by Dr. Herbie Baltimore. Please f/u with this pt and help her with this  Thanks

## 2015-09-19 NOTE — Telephone Encounter (Signed)
Has this been taken care of?

## 2015-09-23 NOTE — Telephone Encounter (Signed)
Can I close this encounter?

## 2015-09-26 NOTE — Telephone Encounter (Signed)
Can this encounter be closed?

## 2015-09-29 ENCOUNTER — Encounter: Payer: Self-pay | Admitting: Cardiology

## 2015-09-29 NOTE — Progress Notes (Signed)
PCP: Rudi Heap, MD  Clinic Note: Chief Complaint  Patient presents with  . Annual Exam  . Chest Pain    periotic pain in  chest, sometimes sharp pain  . Edema    in hands    HPI: Kathryn Horton is a 49 y.o. female with a PMH below who presents today for 1 yr f/u for h/o post-partum cardiomyopathy. She is a former patient of Dr. Susa Griffins last seen in May of 2014. She has a history of postpartum hypertension/eclampsia postpartum cardiomyopathy when the birth of her now 89 year old son. Immediately post partum she had mitral regurgitation that has gradually improved. Her most recent echocardiographic evaluations have shown recovery of left ventricular function as well as mitral valve function. She's also had some palpitations have improved over the last few years as well.  Bassy W Kludt was last seen in October 2015. She was doing well without any major complaints. No medication changes.  Recent Hospitalizations: none  Studies Reviewed: none  Called in with BP readings in ~160/100 range with HA & blurred vision  -- we started her on Lisinopril 2.5 mg in November 2015 -- now doing much better.  Interval History: [name] seems to be doing much better on this visit than last time. Her blood pressure 7 much better controlled since starting low-dose lisinopril. She's not having any more of the headaches or blurred vision or even exertional dyspnea. The only symptom she notes is that her hands have been swelling on her feet. Otherwise from she's been having some twinging type sensations every now and then that happen off and on without any necessary association with exertion. Otherwise from a cardiac standpoint she remains very active and has a relatively normal/negative review of symptoms: No chest pain or shortness of breath with rest or exertion.  No PND, orthopnea or edema.  No palpitations, lightheadedness, dizziness, weakness or syncope/near syncope. No TIA/amaurosis  fugax symptoms. No claudication.  ROS: A comprehensive was performed. Review of Systems  Constitutional: Negative for malaise/fatigue.  Eyes: Negative for blurred vision.  Respiratory: Negative for cough, shortness of breath and wheezing.   Gastrointestinal: Negative for blood in stool and melena.  Genitourinary: Negative for hematuria.  Musculoskeletal: Negative for joint pain and falls.       Hand swelling - her wedding band does not fit  Neurological: Negative for dizziness, seizures (No recurrent seizures) and headaches.  Endo/Heme/Allergies: Does not bruise/bleed easily.  Psychiatric/Behavioral: Negative.   All other systems reviewed and are negative.   Past Medical History  Diagnosis Date  . Palpitations 06/26/2012-07/09/2012    WORE MOINTOR- SHOWED NORMAL  SINUS - SINUS TACHYCARDIA 78 TO 143  . Hypertension in pregnancy, essential, postpartum condition     pregnancy induced - Post Partum Ecclampsia  . Seizures (HCC) 03/06/2000    on first visit to hospital after baby  . Visual disorder 2001    during hih blood pressure with pregnancy  . GERD (gastroesophageal reflux disease)   . Sleep difficulties   . Postpartum cardiomyopathy 2001    Related to Post-partum Ecclampsia; Most Recent Echo EF greater than 55%, normal LV size and function. Mild MR with no MVP (previouly had functional MR)    Past Surgical History  Procedure Laterality Date  . US echocardiography  06/24/2012    EF =>55%, NORMAL  LEFT VENTRICULAR WALL, NORMAL LEFT ATRIAL SIZE,mild mitral regurg, mild tricuspid regurg.  . US echocardiography  09/30/2006    EF=>55% LEFT VENTRICULAR WALL NORMAL, mild  mitral regurgitation, mild tricuspid regurg.  . Treadmill exercise stress test  06/26/2012    negative ti adequate Bruce protocol- exercised for 11 min reaching 15.4 METS ACHEIVING APEAK HRAET RATE OF 185 REPRESENTING A 105% OF HER AGE-PREDICTED MAXIUM HEART RATE, RECVERED IN 6 MINUTES WITH RESTING HEART RATE OF 97  .  Nm myocar perf ejection fraction  09/30/2006    EF 81% , GLOBAL LEFT VENTRICULAR SYSTOLIC FX NORMAL; EXERCISE CAPACITY 8 METS.  . Right lower venous doppler  06/19/2012    NO EVIDENCE OF THROMBUS  . Bilateral lower arterial doppler  07/11/213    NORMAL ABI'S 1.1  . Appendectomy  1998   Prior to Admission medications   Medication Sig Start Date End Date Taking? Authorizing Provider  lisinopril (PRINIVIL,ZESTRIL) 2.5 MG tablet TAKE ONE TABLET BY MOUTH ONCE DAILY 05/26/15   Marykay Lexavid W Harding, MD  pantoprazole (PROTONIX) 40 MG tablet TAKE ONE TABLET BY MOUTH ONCE DAILY 10/26/14   Marykay Lexavid W Harding, MD  zolpidem Remus Loffler(AMBIEN) 10 MG tablet  03/15/15   Historical Provider, MD   No Known Allergies   Social History   Social History  . Marital Status: Married    Spouse Name: N/A  . Number of Children: N/A  . Years of Education: N/A   Social History Main Topics  . Smoking status: Never Smoker   . Smokeless tobacco: None  . Alcohol Use: Yes  . Drug Use: No  . Sexual Activity: Not Asked   Other Topics Concern  . None   Social History Narrative   She is a married (remarried) mother of 3 children 2 from her first marriage they're aged 49 and 322. She has a 49 year old son from her second marriage. 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   Is very active and works full-time As well as exercises.   She never smoked. Social alcohol.   Family History  Problem Relation Age of Onset  . Hypertension Mother   . Arrhythmia Mother     tachycardia,RF SVT  . Hypertension Maternal Grandmother   . Stroke Maternal Grandmother     Wt Readings from Last 3 Encounters:  09/30/15 130 lb 8 oz (59.194 kg)  08/22/15 130 lb (58.968 kg)  03/17/15 133 lb (60.328 kg)    PHYSICAL EXAM BP 112/82 mmHg  Pulse 76  Ht 5\' 4"  (1.626 m)  Wt 130 lb 8 oz (59.194 kg)  BMI 22.39 kg/m2 General appearance: alert, cooperative, appears stated  age, no distress and otherwise healthy-appearing. Neck: no adenopathy, no carotid bruit and no JVD Lungs: clear to auscultation bilaterally, normal percussion bilaterally and non-labored Heart: regular rate and rhythm, S1 &S2 normal, very soft ~1/6 and apex and, no click, rub or gallop; nondisplaced PMI Abdomen: soft, non-tender; bowel sounds normal; no masses, no organomegaly; Extremities: extremities normal, atraumatic, no cyanosis, or edema Pulses: 2+ and symmetric; Skin: normal and mobility and turgor normal  Neurologic: Mental status: Alert, oriented, thought content appropriate Cranial nerves: normal (II-XII grossly intact)   Adult ECG Report  Rate: 76 ;  Rhythm: normal sinus rhythm and Normal axis, intervals and durations;   Narrative Interpretation: Normal EKG   Other studies Reviewed: Additional studies/ records that were reviewed today include:  Recent Labs:  None available   ASSESSMENT / PLAN: Problem List Items Addressed This Visit    Postpartum cardiomyopathy - EF recovered to > 55% with no significant residual MR - Primary (Chronic)  No recurrent episodes of heart failure. With only hand swelling, I'm not convinced that this is a new with a cardiac condition. Nothing to suggest worsening EF. No need for diuretic. She is on an ACE inhibitor which would help prevent recurrence of heart failure.      Relevant Orders   EKG 12-Lead   Heart palpitations (Chronic)    Only associated with stress. Not requiring any medical management. Very rare now.      Relevant Orders   EKG 12-Lead   Essential hypertension (Chronic)    She actually must approve him to have a true hypertension after last visit with some high blood pressures at home. Well-controlled on low-dose lisinopril.      Costochondritis    D twinging-type discomfort in her chest is usually not associated with exertion. It's often on and very atypical in nature. The location of her discomfort is very consistent  with costochondritis. Is recommended when necessary analgesics - NSAIDs or Tylenol.         Current medicines are reviewed at length with the patient today. (+/- concerns) none The following changes have been made: None Studies Ordered:   Orders Placed This Encounter  Procedures  . EKG 12-Lead    ROV 1 YR   HARDING, Piedad Climes, M.D., M.S. Interventional Cardiologist   Pager # [phone]

## 2015-09-30 ENCOUNTER — Ambulatory Visit (INDEPENDENT_AMBULATORY_CARE_PROVIDER_SITE_OTHER): Payer: BC Managed Care – PPO | Admitting: Cardiology

## 2015-09-30 VITALS — BP 112/82 | HR 76 | Ht 64.0 in | Wt 130.5 lb

## 2015-09-30 DIAGNOSIS — R002 Palpitations: Secondary | ICD-10-CM

## 2015-09-30 DIAGNOSIS — M940 Chondrocostal junction syndrome [Tietze]: Secondary | ICD-10-CM | POA: Diagnosis not present

## 2015-09-30 DIAGNOSIS — O903 Peripartum cardiomyopathy: Secondary | ICD-10-CM | POA: Diagnosis not present

## 2015-09-30 DIAGNOSIS — I10 Essential (primary) hypertension: Secondary | ICD-10-CM | POA: Diagnosis not present

## 2015-09-30 NOTE — Patient Instructions (Signed)
No changes at current time.  Your physician wants you to follow-up in 12 months with Dr Herbie BaltimoreHARDING.  You will receive a reminder letter in the mail two months in advance. If you don't receive a letter, please call our office to schedule the follow-up appointment.  If you need a refill on your cardiac medications before your next appointment, please call your pharmacy.

## 2015-10-02 ENCOUNTER — Encounter: Payer: Self-pay | Admitting: Cardiology

## 2015-10-02 DIAGNOSIS — M940 Chondrocostal junction syndrome [Tietze]: Secondary | ICD-10-CM | POA: Insufficient documentation

## 2015-10-02 NOTE — Assessment & Plan Note (Signed)
She actually must approve him to have a true hypertension after last visit with some high blood pressures at home. Well-controlled on low-dose lisinopril.

## 2015-10-02 NOTE — Assessment & Plan Note (Signed)
D twinging-type discomfort in her chest is usually not associated with exertion. It's often on and very atypical in nature. The location of her discomfort is very consistent with costochondritis. Is recommended when necessary analgesics - NSAIDs or Tylenol.

## 2015-10-02 NOTE — Assessment & Plan Note (Signed)
Only associated with stress. Not requiring any medical management. Very rare now.

## 2015-10-02 NOTE — Assessment & Plan Note (Signed)
No recurrent episodes of heart failure. With only hand swelling, I'm not convinced that this is a new with a cardiac condition. Nothing to suggest worsening EF. No need for diuretic. She is on an ACE inhibitor which would help prevent recurrence of heart failure.

## 2015-11-22 ENCOUNTER — Other Ambulatory Visit: Payer: Self-pay | Admitting: Cardiology

## 2015-11-22 ENCOUNTER — Other Ambulatory Visit: Payer: Self-pay

## 2015-11-22 DIAGNOSIS — Z1231 Encounter for screening mammogram for malignant neoplasm of breast: Secondary | ICD-10-CM

## 2015-11-22 NOTE — Telephone Encounter (Signed)
Rx(s) sent to pharmacy electronically.  

## 2015-11-23 ENCOUNTER — Telehealth: Payer: Self-pay | Admitting: Cardiology

## 2015-11-23 NOTE — Telephone Encounter (Signed)
Pt needs a prior authorization for Pantoprazole.Please call [phone].

## 2015-12-02 ENCOUNTER — Encounter: Payer: Self-pay | Admitting: Family Medicine

## 2015-12-02 ENCOUNTER — Ambulatory Visit (INDEPENDENT_AMBULATORY_CARE_PROVIDER_SITE_OTHER): Payer: BC Managed Care – PPO | Admitting: Family Medicine

## 2015-12-02 VITALS — BP 114/71 | HR 89 | Temp 97.7°F | Ht 64.0 in | Wt 133.2 lb

## 2015-12-02 DIAGNOSIS — R6883 Chills (without fever): Secondary | ICD-10-CM | POA: Diagnosis not present

## 2015-12-02 DIAGNOSIS — J0100 Acute maxillary sinusitis, unspecified: Secondary | ICD-10-CM | POA: Diagnosis not present

## 2015-12-02 LAB — POCT INFLUENZA A/B
INFLUENZA A, POC: NEGATIVE
Influenza B, POC: NEGATIVE

## 2015-12-02 MED ORDER — AMOXICILLIN-POT CLAVULANATE 875-125 MG PO TABS: 1.0000 | ORAL_TABLET | Freq: Two times a day (BID) | ORAL | Status: DC

## 2015-12-02 MED ORDER — FLUTICASONE PROPIONATE 50 MCG/ACT NA SUSP: 2.0000 | Freq: Every day | NASAL | Status: DC

## 2015-12-02 NOTE — Progress Notes (Signed)
   HPI  Patient presents today here to discuss an acute illness.  Patient explains that for the past 5 days she's had malaise, chills, and generally feeling unwell, over the last 3 days she has developed cough, sore throat, congestion with green and bloody mucus,.  After discussion she has had left-sided facial tenderness for the last 5-6 days  She has a sister and nephew with recent flu diagnosis, she was around her nephew last weekend who stays with her each weekend.  She's planning to travel this weekend and wanted to be sure that she is not contagious.  PMH: Smoking status noted ROS: Per HPI  Objective: BP 114/71 mmHg  Pulse 89  Temp(Src) 97.7 F (36.5 C) (Oral)  Ht 5\' 4"  (1.626 m)  Wt 133 lb 3.2 oz (60.419 kg)  BMI 22.85 kg/m2 Gen: NAD, alert, cooperative with exam HEENT: NCAT, TMs normal bilaterally, oropharynx clear, nares with some swelling on the left, left-sided maxillary facial tenderness to palpation CV: RRR, good S1/S2, no murmur Resp: Nonlabored, few scattered expiratory rhonchi Ext: No edema, warm Neuro: Alert and oriented, No gross deficits  Rapid flu negative   Assessment and plan:  # Acute sinusitis L sided maxillary tenderness for 5-6 days, now with systemic symps Augmentin, flonase Supportive care discussed RTC if worsening or does not improve as expected.     Orders Placed This Encounter  Procedures  . POCT Influenza A/B    Meds ordered this encounter  Medications  . amoxicillin-clavulanate (AUGMENTIN) 875-125 MG tablet    Sig: Take 1 tablet by mouth 2 (two) times daily.    Dispense:  20 tablet    Refill:  0    Murtis SinkSam Bradshaw, MD [facility] 12/02/2015, 11:21 AM

## 2015-12-02 NOTE — Patient Instructions (Addendum)
Great to mete you!  I am treating you with augmentin for a sinus infection.  Your flu test was negative, so We do not expect that you are contagious, however good handwashing is very important regardless.   Use a pro-biotic or eat yogurt daily while taking the antibiotic.   Sinusitis, Adult Sinusitis is redness, soreness, and puffiness (inflammation) of the air pockets in the bones of your face (sinuses). The redness, soreness, and puffiness can cause air and mucus to get trapped in your sinuses. This can allow germs to grow and cause an infection.  HOME CARE   Drink enough fluids to keep your pee (urine) clear or pale yellow.  Use a humidifier in your home.  Run a hot shower to create steam in the bathroom. Sit in the bathroom with the door closed. Breathe in the steam 3-4 times a day.  Put a warm, moist washcloth on your face 3-4 times a day, or as told by your doctor.  Use salt water sprays (saline sprays) to wet the thick fluid in your nose. This can help the sinuses drain.  Only take medicine as told by your doctor. GET HELP RIGHT AWAY IF:   Your pain gets worse.  You have very bad headaches.  You are sick to your stomach (nauseous).  You throw up (vomit).  You are very sleepy (drowsy) all the time.  Your face is puffy (swollen).  Your vision changes.  You have a stiff neck.  You have trouble breathing. MAKE SURE YOU:   Understand these instructions.  Will watch your condition.  Will get help right away if you are not doing well or get worse.   This information is not intended to replace advice given to you by your health care provider. Make sure you discuss any questions you have with your health care provider.   Document Released: 05/14/2008 Document Revised: 12/17/2014 Document Reviewed: 07/01/2012 Elsevier Interactive Patient Education [employer]2016 [employer] Inc.

## 2015-12-20 ENCOUNTER — Ambulatory Visit
Admission: RE | Admit: 2015-12-20 | Discharge: 2015-12-20 | Disposition: A | Discharge status: Home / Self Care | Payer: BC Managed Care – PPO | Source: Ambulatory Visit

## 2015-12-20 DIAGNOSIS — Z1231 Encounter for screening mammogram for malignant neoplasm of breast: Secondary | ICD-10-CM

## 2016-01-06 ENCOUNTER — Other Ambulatory Visit: Payer: Self-pay | Admitting: Cardiology

## 2016-01-09 NOTE — Telephone Encounter (Signed)
REFILL 

## 2016-05-01 ENCOUNTER — Encounter: Payer: Self-pay | Admitting: Family Medicine

## 2016-05-01 ENCOUNTER — Ambulatory Visit (INDEPENDENT_AMBULATORY_CARE_PROVIDER_SITE_OTHER): Payer: BC Managed Care – PPO | Admitting: Family Medicine

## 2016-05-01 VITALS — BP 125/83 | HR 74 | Temp 98.2°F | Ht 64.0 in | Wt 128.8 lb

## 2016-05-01 DIAGNOSIS — N390 Urinary tract infection, site not specified: Secondary | ICD-10-CM | POA: Diagnosis not present

## 2016-05-01 LAB — URINALYSIS, COMPLETE
Bilirubin, UA: NEGATIVE
GLUCOSE, UA: NEGATIVE
Ketones, UA: NEGATIVE
NITRITE UA: POSITIVE — AB
PROTEIN UA: NEGATIVE
RBC, UA: NEGATIVE
SPEC GRAV UA: 1.01 (ref 1.005–1.030)
Urobilinogen, Ur: 0.2 mg/dL (ref 0.2–1.0)
pH, UA: 7 (ref 5.0–7.5)

## 2016-05-01 LAB — MICROSCOPIC EXAMINATION: RBC MICROSCOPIC, UA: NONE SEEN /HPF (ref 0–?)

## 2016-05-01 MED ORDER — CIPROFLOXACIN HCL 250 MG PO TABS: 250.0000 mg | ORAL_TABLET | Freq: Two times a day (BID) | ORAL | Status: DC

## 2016-05-01 NOTE — Patient Instructions (Signed)
Great to meet you!  Be sure to finish all antibiotics  Please call us if you have any concerns  Urinary Tract Infection Urinary tract infections (UTIs) can develop anywhere along your urinary tract. Your urinary tract is your body's drainage system for removing wastes and extra water. Your urinary tract includes two kidneys, two ureters, a bladder, and a urethra. Your kidneys are a pair of bean-shaped organs. Each kidney is about the size of your fist. They are located below your ribs, one on each side of your spine. CAUSES Infections are caused by microbes, which are microscopic organisms, including fungi, viruses, and bacteria. These organisms are so small that they can only be seen through a microscope. Bacteria are the microbes that most commonly cause UTIs. SYMPTOMS  Symptoms of UTIs may vary by age and gender of the patient and by the location of the infection. Symptoms in young women typically include a frequent and intense urge to urinate and a painful, burning feeling in the bladder or urethra during urination. Older women and men are more likely to be tired, shaky, and weak and have muscle aches and abdominal pain. A fever may mean the infection is in your kidneys. Other symptoms of a kidney infection include pain in your back or sides below the ribs, nausea, and vomiting. DIAGNOSIS To diagnose a UTI, your caregiver will ask you about your symptoms. Your caregiver will also ask you to provide a urine sample. The urine sample will be tested for bacteria and white blood cells. White blood cells are made by your body to help fight infection. TREATMENT  Typically, UTIs can be treated with medication. Because most UTIs are caused by a bacterial infection, they usually can be treated with the use of antibiotics. The choice of antibiotic and length of treatment depend on your symptoms and the type of bacteria causing your infection. HOME CARE INSTRUCTIONS  If you were prescribed antibiotics,  take them exactly as your caregiver instructs you. Finish the medication even if you feel better after you have only taken some of the medication.  Drink enough water and fluids to keep your urine clear or pale yellow.  Avoid caffeine, tea, and carbonated beverages. They tend to irritate your bladder.  Empty your bladder often. Avoid holding urine for long periods of time.  Empty your bladder before and after sexual intercourse.  After a bowel movement, women should cleanse from front to back. Use each tissue only once. SEEK MEDICAL CARE IF:   You have back pain.  You develop a fever.  Your symptoms do not begin to resolve within 3 days. SEEK IMMEDIATE MEDICAL CARE IF:   You have severe back pain or lower abdominal pain.  You develop chills.  You have nausea or vomiting.  You have continued burning or discomfort with urination. MAKE SURE YOU:   Understand these instructions.  Will watch your condition.  Will get help right away if you are not doing well or get worse.   This information is not intended to replace advice given to you by your health care provider. Make sure you discuss any questions you have with your health care provider.   Document Released: 09/05/2005 Document Revised: 08/17/2015 Document Reviewed: 01/04/2012 Elsevier Interactive Patient Education [employer]2016 Elsevier Inc.

## 2016-05-01 NOTE — Progress Notes (Signed)
   HPI  Patient presents today here with concern for UTI.  Patient explains the last 2-3 days she's had dysuria, urinary urgency, urinary frequency, nausea.  She had chills last night and worsening of the dysuria.  She denies any back pain, she's had bilateral lower abdominal pain that is mild in nature.  She's tolerating food and fluids normally.  She does not have any fever  PMH: Smoking status noted ROS: Per HPI  Objective: BP 125/83 mmHg  Pulse 74  Temp(Src) 98.2 F (36.8 C) (Oral)  Ht 5\' 4"  (1.626 m)  Wt 128 lb 12.8 oz (58.423 kg)  BMI 22.10 kg/m2 Gen: NAD, alert, cooperative with exam HEENT: NCAT CV: RRR, good S1/S2, no murmur Resp: CTABL, no wheezes, non-labored Abd: BS present, no guarding or organomegaly, no CVA tenderness, mild suprapubic tenderness to palpation Ext: No edema, warm Neuro: Alert and oriented, No gross deficits  Assessment and plan:  # UTI Treat with ciprofloxacin Uncomplicated, no signs of pyelonephritis Return to clinic as needed Send for culture     Orders Placed This Encounter  Procedures  . Urine culture    Order Specific Question:  Source    Answer:  clean catch  . Urinalysis, Complete    Meds ordered this encounter  Medications  . ciprofloxacin (CIPRO) 250 MG tablet    Sig: Take 1 tablet (250 mg total) by mouth 2 (two) times daily.    Dispense:  6 tablet    Refill:  0    Murtis SinkSam Bradshaw, MD [facility] 05/01/2016, 5:01 PM

## 2016-05-03 LAB — URINE CULTURE

## 2016-10-12 ENCOUNTER — Other Ambulatory Visit: Payer: Self-pay | Admitting: Cardiology

## 2016-11-12 ENCOUNTER — Other Ambulatory Visit: Payer: Self-pay | Admitting: Cardiology

## 2016-11-12 NOTE — Telephone Encounter (Signed)
Rx(s) sent to pharmacy electronically.  

## 2016-12-06 ENCOUNTER — Other Ambulatory Visit: Payer: Self-pay | Admitting: Cardiology

## 2016-12-07 ENCOUNTER — Other Ambulatory Visit: Payer: Self-pay | Admitting: Obstetrics and Gynecology

## 2016-12-07 DIAGNOSIS — Z1231 Encounter for screening mammogram for malignant neoplasm of breast: Secondary | ICD-10-CM

## 2016-12-22 ENCOUNTER — Other Ambulatory Visit: Payer: Self-pay | Admitting: Cardiology

## 2016-12-28 ENCOUNTER — Ambulatory Visit
Admission: RE | Admit: 2016-12-28 | Discharge: 2016-12-28 | Disposition: A | Discharge status: Home / Self Care | Payer: BC Managed Care – PPO | Source: Ambulatory Visit | Attending: Obstetrics and Gynecology | Admitting: Obstetrics and Gynecology

## 2016-12-28 DIAGNOSIS — Z1231 Encounter for screening mammogram for malignant neoplasm of breast: Secondary | ICD-10-CM

## 2017-01-12 ENCOUNTER — Other Ambulatory Visit: Payer: Self-pay | Admitting: Cardiology

## 2017-01-18 ENCOUNTER — Ambulatory Visit: Payer: BC Managed Care – PPO | Admitting: Cardiology

## 2017-01-21 ENCOUNTER — Ambulatory Visit: Payer: BC Managed Care – PPO | Admitting: Cardiology

## 2017-01-25 ENCOUNTER — Other Ambulatory Visit: Payer: Self-pay | Admitting: Cardiology

## 2017-01-25 NOTE — Telephone Encounter (Signed)
REFILL 

## 2017-02-08 ENCOUNTER — Ambulatory Visit (INDEPENDENT_AMBULATORY_CARE_PROVIDER_SITE_OTHER): Payer: BC Managed Care – PPO | Admitting: Cardiology

## 2017-02-08 ENCOUNTER — Encounter: Payer: Self-pay | Admitting: Cardiology

## 2017-02-08 VITALS — BP 120/82 | HR 80 | Ht 64.0 in | Wt 132.0 lb

## 2017-02-08 DIAGNOSIS — I10 Essential (primary) hypertension: Secondary | ICD-10-CM

## 2017-02-08 DIAGNOSIS — R002 Palpitations: Secondary | ICD-10-CM | POA: Diagnosis not present

## 2017-02-08 DIAGNOSIS — O903 Peripartum cardiomyopathy: Secondary | ICD-10-CM | POA: Diagnosis not present

## 2017-02-08 NOTE — Patient Instructions (Signed)
No medication changes  Your physician wants you to follow-up in: ONE YEAR WITH DR. HARDING. You will receive a reminder letter in the mail two months in advance. If you don't receive a letter, please call our office to schedule the follow-up appointment.

## 2017-02-08 NOTE — Progress Notes (Signed)
PCP: Kevin Fenton, MD  Clinic Note: Chief Complaint  Patient presents with  . Follow-up    HPI: Kathryn Horton is a 51 y.o. female with a PMH below who presents today for Delayed annual follow-up for history of postpartum cardiomyopathy. She is a former patient of Dr. Susa Griffins last seen in May of 2014. She has a history of postpartum hypertension/eclampsia postpartum cardiomyopathy when the birth of her now 38 year old son. Immediately post partum she had mitral regurgitation that has gradually improved. Her most recent echocardiographic evaluations have shown recovery of left ventricular function as well as mitral valve function. She's also had some palpitations have improved over the last few years as well. Kathryn Horton was last seen on 09/30/2015. She is doing relatively well with exception of having high blood pressures. She was started on lisinopril 2.5 mg in November 2015 and her blood pressures looked much better since then.  Recent Hospitalizations: None  Studies Reviewed: None  Interval History: Kathryn Horton is doing great today. She has not had any further complaints once of her heart failure. She has no PND, orthopnea or edema. No resting or exertional dyspnea. She is very active. She has no evidence of fatigue and is very happy with the dose of lisinopril. She states her blood pressures being great. The only new change for her from a medical standpoint is that she is on a new type of hormone replacement for menopause. Otherwise she is doing very well with no complaints for cardiac standpoint.   No chest pain or shortness of breath with rest or exertion. No PND, orthopnea or edema. No palpitations, lightheadedness, dizziness, weakness or syncope/near syncope. No TIA/amaurosis fugax symptoms. No claudication.  ROS: A comprehensive was performed. Review of Systems  HENT: Negative for nosebleeds.   Gastrointestinal: Negative for blood in stool and melena.    Genitourinary: Negative for hematuria.  Endo/Heme/Allergies:       Symptoms of menopause are notably improved with her hormone replacement therapy.  All other systems reviewed and are negative.    Past Medical History:  Diagnosis Date  . GERD (gastroesophageal reflux disease)   . Hypertension in pregnancy, essential, postpartum condition    pregnancy induced - Post Partum Ecclampsia  . Palpitations 06/26/2012-07/09/2012   WORE MOINTOR- SHOWED NORMAL  SINUS - SINUS TACHYCARDIA 78 TO 143  . Postpartum cardiomyopathy 2001   Related to Post-partum Ecclampsia; Most Recent Echo EF greater than 55%, normal LV size and function. Mild MR with no MVP (previouly had functional MR)  . Seizures (HCC) 03/06/2000   on first visit to hospital after baby  . Sleep difficulties   . Visual disorder 2001   during hih blood pressure with pregnancy    Past Surgical History:  Procedure Laterality Date  . APPENDECTOMY  1998  . BILATERAL LOWER ARTERIAL DOPPLER  07/11/213   NORMAL ABI'S 1.1  . NM MYOCAR PERF EJECTION FRACTION  09/30/2006   EF 81% , GLOBAL LEFT VENTRICULAR SYSTOLIC FX NORMAL; EXERCISE CAPACITY 8 METS.  Marland Kitchen RIGHT LOWER VENOUS DOPPLER  06/19/2012   NO EVIDENCE OF THROMBUS  . TREADMILL EXERCISE STRESS TEST  06/26/2012   negative ti adequate Bruce protocol- exercised for 11 min reaching 15.4 METS ACHEIVING APEAK HRAET RATE OF 185 REPRESENTING A 105% OF HER AGE-PREDICTED MAXIUM HEART RATE, RECVERED IN 6 MINUTES WITH RESTING HEART RATE OF 97  . US ECHOCARDIOGRAPHY  06/24/2012   EF =>55%, NORMAL  LEFT VENTRICULAR WALL, NORMAL LEFT ATRIAL SIZE,mild mitral regurg, mild  tricuspid regurg.  Marland Kitchen US ECHOCARDIOGRAPHY  09/30/2006   EF=>55% LEFT VENTRICULAR WALL NORMAL, mild mitral regurgitation, mild tricuspid regurg.    Current Meds  Medication Sig  . Estradiol-Norethindrone Acet (MIMVEY PO) Take 1 tablet by mouth daily.  Marland Kitchen lisinopril (PRINIVIL,ZESTRIL) 2.5 MG tablet TAKE ONE TABLET BY MOUTH ONCE DAILY   . pantoprazole (PROTONIX) 40 MG tablet Take 1 tablet (40 mg total) by mouth daily.  Marland Kitchen zolpidem (AMBIEN) 10 MG tablet Take 10 mg by mouth at bedtime.     No Known Allergies  Social History   Social History  . Marital status: Married    Spouse name: N/A  . Number of children: N/A  . Years of education: N/A   Social History Main Topics  . Smoking status: Never Smoker  . Smokeless tobacco: Never Used  . Alcohol use Yes  . Drug use: No  . Sexual activity: Not Asked   Other Topics Concern  . None   Social History Narrative   She is a married (remarried) mother of 3 children 2 from her first marriage they're aged 55 and 43. She has a 41 year old son from her second marriage. 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   Is very active and works full-time As well as exercises.   She never smoked. Social alcohol.    family history includes Arrhythmia in her mother; Hypertension in her maternal grandmother and mother; Stroke in her maternal grandmother.  Wt Readings from Last 3 Encounters:  02/08/17 59.9 kg (132 lb)  05/01/16 58.4 kg (128 lb 12.8 oz)  12/02/15 60.4 kg (133 lb 3.2 oz)    PHYSICAL EXAM BP 120/82   Pulse 80   Ht 5\' 4"  (1.626 m)   Wt 59.9 kg (132 lb)   BMI 22.66 kg/m  General appearance: alert, cooperative, appears stated age, no distress and Well-nourished, well-groomed HEENT: [place]/AT, EOMI, MMM, anicteric sclera Neck: no adenopathy, no carotid bruit and no JVD Lungs: clear to auscultation bilaterally, normal percussion bilaterally and non-labored Heart: regular rate and rhythm, S1 & S2 normal, no murmur, click, rub or gallop ; nondisplaced PMI Abdomen: soft, non-tender; bowel sounds normal; no masses,  no organomegaly;  Extremities: extremities normal, atraumatic, no cyanosis, or edema  Pulses: 2+ and symmetric;  Skin: mobility and turgor normal, no evidence of bleeding or bruising  and no lesions noted  Neurologic: Mental status: Alert, oriented, thought content appropriate    Adult ECG Report  Rate: 80 ;  Rhythm: normal sinus rhythm and Normal axis, intervals and durations;   Narrative Interpretation: Stable EKG   Other studies Reviewed: Additional studies/ records that were reviewed today include:  Recent Labs:  Checked by PCP     ASSESSMENT / PLAN: Problem List Items Addressed This Visit    Essential hypertension (Chronic)    Well-controlled with lisinopril.      Relevant Orders   EKG 12-Lead   Heart palpitations (Chronic)    No real recurrent symptoms. At this point I would not treat unless they recur.      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 Recovered EF. No heart failure symptoms. She is on stable dose of ACE inhibitor and not requiring diuretic.       Relevant Orders   EKG 12-Lead  Current medicines are reviewed at length with the patient today. (+/- concerns) None The following changes have been made: None  Patient Instructions  No medication changes  Your physician wants you to follow-up in: ONE YEAR WITH DR. HARDING. You will receive a reminder letter in the mail two months in advance. If you don't receive a letter, please call our office to schedule the follow-up appointment.    Studies Ordered:   Orders Placed This Encounter  Procedures  . EKG 12-Lead      Bryan Lemmaavid Harding, M.D., M.S. Interventional Cardiologist   Pager # [phone] Phone # [phone] [address] [place], [place] [address]

## 2017-02-10 NOTE — Assessment & Plan Note (Signed)
No real recurrent symptoms. At this point I would not treat unless they recur.

## 2017-02-10 NOTE — Assessment & Plan Note (Signed)
Totally asymptomatic now would no recurrent symptoms. Recovered EF. No heart failure symptoms. She is on stable dose of ACE inhibitor and not requiring diuretic.

## 2017-02-10 NOTE — Assessment & Plan Note (Signed)
Well-controlled with lisinopril

## 2017-04-26 ENCOUNTER — Other Ambulatory Visit: Payer: Self-pay | Admitting: Cardiology

## 2017-08-03 ENCOUNTER — Other Ambulatory Visit: Payer: Self-pay | Admitting: Cardiology

## 2017-08-05 NOTE — Telephone Encounter (Signed)
REFILL 

## 2017-10-04 ENCOUNTER — Ambulatory Visit: Payer: BC Managed Care – PPO | Admitting: Family Medicine

## 2017-10-04 ENCOUNTER — Encounter: Payer: Self-pay | Admitting: Family Medicine

## 2017-10-04 ENCOUNTER — Ambulatory Visit (INDEPENDENT_AMBULATORY_CARE_PROVIDER_SITE_OTHER): Payer: BC Managed Care – PPO | Admitting: Family Medicine

## 2017-10-04 VITALS — BP 123/81 | HR 73 | Temp 98.4°F | Ht 64.0 in | Wt 127.0 lb

## 2017-10-04 DIAGNOSIS — L739 Follicular disorder, unspecified: Secondary | ICD-10-CM

## 2017-10-04 DIAGNOSIS — L729 Follicular cyst of the skin and subcutaneous tissue, unspecified: Secondary | ICD-10-CM | POA: Diagnosis not present

## 2017-10-04 MED ORDER — MUPIROCIN CALCIUM 2 % NA OINT: 1.0000 "application " | TOPICAL_OINTMENT | Freq: Two times a day (BID) | NASAL | 0 refills | Status: DC

## 2017-10-04 NOTE — Progress Notes (Signed)
   BP 123/81   Pulse 73   Temp 98.4 F (36.9 C) (Oral)   Ht 5\' 4"  (1.626 m)   Wt 127 lb (57.6 kg)   BMI 21.80 kg/m    Subjective:    Patient ID: Kathryn Horton, female    DOB: 05/04/1966, 51 y.o.   MRN: [id number]  HPI: Kathryn Horton is a 51 y.o. female presenting on 10/04/2017 for Nasal Sore   HPI Nasal sore Patient is coming in with complaints of a nasal sore in her left nare that has been there for over 10 weeks.  She says it does swell a little and she does have some more clear drainage from that nostril.  It does not seem to have grown in size or gotten smaller.  She denies any fevers or chills or rash or drainage anywhere else.  She has not had this before in her life.  She did try some intranasal topical antibiotic but not consistently and has tried some saline washes  Relevant past medical, surgical, family and social history reviewed and updated as indicated. Interim medical history since our last visit reviewed. Allergies and medications reviewed and updated.  Review of Systems  Constitutional: Negative for chills and fever.  HENT: Positive for rhinorrhea. Negative for ear pain, facial swelling, nosebleeds, postnasal drip, sinus pain, sinus pressure, sneezing and sore throat.   Eyes: Negative for visual disturbance.  Respiratory: Negative for chest tightness and shortness of breath.   Cardiovascular: Negative for chest pain and leg swelling.  Musculoskeletal: Negative for back pain and gait problem.  Skin: Negative for rash.  Neurological: Negative for light-headedness and headaches.  Psychiatric/Behavioral: Negative for agitation and behavioral problems.  All other systems reviewed and are negative.   Per HPI unless specifically indicated above        Objective:    BP 123/81   Pulse 73   Temp 98.4 F (36.9 C) (Oral)   Ht 5\' 4"  (1.626 m)   Wt 127 lb (57.6 kg)   BMI 21.80 kg/m   Wt Readings from Last 3 Encounters:  10/04/17 127 lb (57.6 kg)    02/08/17 132 lb (59.9 kg)  05/01/16 128 lb 12.8 oz (58.4 kg)    Physical Exam  Constitutional: She is oriented to person, place, and time. She appears well-developed and well-nourished. No distress.  HENT:  Nose: Mucosal edema (Left nostril edema) and rhinorrhea present.  Mouth/Throat: Oropharynx is clear and moist and mucous membranes are normal.  Eyes: Conjunctivae are normal.  Musculoskeletal: Normal range of motion.  Neurological: She is alert and oriented to person, place, and time. Coordination normal.  Skin: Skin is warm and dry. No rash noted. She is not diaphoretic.  Psychiatric: She has a normal mood and affect. Her behavior is normal.  Nursing note and vitals reviewed.      Assessment & Plan:   Problem List Items Addressed This Visit    None    Visit Diagnoses    Nasal folliculitis    -  Primary   Relevant Medications   mupirocin nasal ointment (BACTROBAN NASAL) 2 %       Follow up plan: Return if symptoms worsen or fail to improve.  Counseling provided for all of the vaccine components No orders of the defined types were placed in this encounter.   Arville CareJoshua Kimber Fritts, MD [facility] 10/04/2017, 3:17 PM

## 2017-11-28 ENCOUNTER — Other Ambulatory Visit: Payer: Self-pay | Admitting: Obstetrics and Gynecology

## 2017-11-28 DIAGNOSIS — Z1231 Encounter for screening mammogram for malignant neoplasm of breast: Secondary | ICD-10-CM

## 2018-01-01 ENCOUNTER — Ambulatory Visit
Admission: RE | Admit: 2018-01-01 | Discharge: 2018-01-01 | Disposition: A | Discharge status: Home / Self Care | Payer: BC Managed Care – PPO | Source: Ambulatory Visit | Attending: Obstetrics and Gynecology | Admitting: Obstetrics and Gynecology

## 2018-01-01 DIAGNOSIS — Z1231 Encounter for screening mammogram for malignant neoplasm of breast: Secondary | ICD-10-CM

## 2018-01-20 ENCOUNTER — Other Ambulatory Visit: Payer: Self-pay | Admitting: Cardiology

## 2018-01-20 NOTE — Telephone Encounter (Signed)
REFILL 

## 2018-02-09 ENCOUNTER — Other Ambulatory Visit: Payer: Self-pay | Admitting: Cardiology

## 2018-02-10 NOTE — Telephone Encounter (Signed)
REFILL 

## 2018-04-21 ENCOUNTER — Other Ambulatory Visit: Payer: Self-pay | Admitting: Cardiology

## 2018-05-12 ENCOUNTER — Other Ambulatory Visit: Payer: Self-pay | Admitting: Cardiology

## 2018-05-13 NOTE — Telephone Encounter (Signed)
Rx has been sent to the pharmacy electronically. ° °

## 2018-06-01 ENCOUNTER — Other Ambulatory Visit: Payer: Self-pay | Admitting: Cardiology

## 2018-06-02 NOTE — Telephone Encounter (Signed)
Rx request sent to pharmacy.  

## 2018-06-03 ENCOUNTER — Other Ambulatory Visit: Payer: Self-pay | Admitting: Cardiology

## 2018-06-20 ENCOUNTER — Other Ambulatory Visit: Payer: Self-pay | Admitting: Cardiology

## 2018-06-23 ENCOUNTER — Other Ambulatory Visit: Payer: Self-pay

## 2018-06-24 ENCOUNTER — Other Ambulatory Visit: Payer: Self-pay | Admitting: Cardiology

## 2018-06-24 NOTE — Telephone Encounter (Signed)
New Message:        *STAT* If patient is at the pharmacy, call can be transferred to refill team.   1. Which medications need to be refilled? (please list name of each medication and dose if known) lisinopril (PRINIVIL,ZESTRIL) 2.5 MG tablet  pantoprazole (PROTONIX) 40 MG tablet  2. Which pharmacy/location (including street and city if local pharmacy) is medication to be sent to?[facility] [address] - [place], [place] - [address]  3. Do they need a 30 day or 90 day supply? 30

## 2018-06-25 MED ORDER — PANTOPRAZOLE SODIUM 40 MG PO TBEC: 40.0000 mg | DELAYED_RELEASE_TABLET | Freq: Every day | ORAL | 0 refills | Status: DC

## 2018-06-25 MED ORDER — LISINOPRIL 2.5 MG PO TABS: ORAL_TABLET | ORAL | 1 refills | Status: DC

## 2018-07-12 ENCOUNTER — Other Ambulatory Visit: Payer: Self-pay | Admitting: Cardiology

## 2018-07-14 ENCOUNTER — Other Ambulatory Visit: Payer: Self-pay | Admitting: *Deleted

## 2018-07-14 MED ORDER — PANTOPRAZOLE SODIUM 40 MG PO TBEC: 40.0000 mg | DELAYED_RELEASE_TABLET | Freq: Every day | ORAL | 0 refills | Status: DC

## 2018-08-26 ENCOUNTER — Other Ambulatory Visit: Payer: Self-pay | Admitting: Cardiology

## 2018-09-08 ENCOUNTER — Encounter: Payer: Self-pay | Admitting: Cardiology

## 2018-09-08 ENCOUNTER — Ambulatory Visit: Payer: BC Managed Care – PPO | Admitting: Cardiology

## 2018-09-08 VITALS — BP 123/82 | HR 70 | Ht 64.0 in | Wt 129.8 lb

## 2018-09-08 DIAGNOSIS — R002 Palpitations: Secondary | ICD-10-CM

## 2018-09-08 DIAGNOSIS — O903 Peripartum cardiomyopathy: Secondary | ICD-10-CM | POA: Diagnosis not present

## 2018-09-08 DIAGNOSIS — I10 Essential (primary) hypertension: Secondary | ICD-10-CM | POA: Diagnosis not present

## 2018-09-08 MED ORDER — PANTOPRAZOLE SODIUM 40 MG PO TBEC
40.0000 mg | DELAYED_RELEASE_TABLET | Freq: Every day | ORAL | 3 refills | Status: DC
Start: 2018-09-08 — End: 2018-12-17

## 2018-09-08 MED ORDER — LISINOPRIL 2.5 MG PO TABS: ORAL_TABLET | ORAL | 3 refills | Status: DC

## 2018-09-08 NOTE — Progress Notes (Addendum)
PCP: Elenora Gamma, MD  OB/Gyn : Dr. Harold Hedge  Clinic Note: Chief Complaint  Patient presents with  . Follow-up    52-month  . Hypertension    Controlled  . Palpitations    Stable    HPI: Kathryn Horton is a 52 y.o. female with a PMH notable for post-partum CM (compication of HTN/Ecclampsia) who presents today for ~18 month f/u.Marland Kitchen  She is a former patient of Dr. Susa Griffins last seen in May of 2014. She has a history of postpartum hypertension/eclampsia postpartum cardiomyopathy when the birth of her now 26 year old son. Immediately post partum she had mitral regurgitation that has gradually improved. Her most recent echocardiographic evaluations have shown recovery of left ventricular function as well as mitral valve function. She's also had some palpitations have improved over the last few years as well.  Kathryn Horton was last seen in March 2018 -- doing well. NO SSx of CHF. Active - no CP.  No fatigue.  BP stable on ACE-I. Had started HRT for menopause.  Recent Hospitalizations:  n/a  Studies Personally Reviewed - (if available, images/films reviewed: From Epic Chart or Care Everywhere)  n/a  Interval History: Kathryn Horton today doing well with no cardiac complaints.  She has not had any further episodes of heart failure or anginal type symptoms.  She says her blood pressure has been great since we started low-dose ACE inhibitor.  She can actually feel her energy level having improved since starting the ACE inhibitor.  If she misses a dose or 2, she feels her blood pressure go up and she starts feeling just a little more tired and more dyspneic. She really says she is only is off and on little skipped beats but nothing the last more than 1 or 2 beats.  No symptoms. She still using the estrogen patch and says that helps with her energy level as well.  Cardiac review of symptoms as follows: No chest pain or shortness of breath with rest or exertion.  No  PND, orthopnea or edema.  No palpitations, lightheadedness, dizziness, weakness or syncope/near syncope. No TIA/amaurosis fugax symptoms. No claudication.  Review of Systems  Constitutional: Negative for malaise/fatigue and weight loss.  HENT: Negative for nosebleeds.   Respiratory: Negative for cough and shortness of breath.   Gastrointestinal: Negative for blood in stool and melena.  Musculoskeletal: Negative for joint pain.  Neurological: Negative for dizziness.  Psychiatric/Behavioral: Negative for depression and memory loss. The patient is not nervous/anxious.    I have reviewed and (if needed) personally updated the patient's problem list, medications, allergies, past medical and surgical history, social and family history.   Past Medical History:  Diagnosis Date  . GERD (gastroesophageal reflux disease)   . Hypertension in pregnancy, essential, postpartum condition    pregnancy induced - Post Partum Ecclampsia  . Palpitations 06/26/2012-07/09/2012   WORE MOINTOR- SHOWED NORMAL  SINUS - SINUS TACHYCARDIA 78 TO 143  . Postpartum cardiomyopathy 2001   Related to Post-partum Ecclampsia; Most Recent Echo EF greater than 55%, normal LV size and function. Mild MR with no MVP (previouly had functional MR)  . Seizures ([facility]) 03/06/2000   on first visit to hospital after baby  . Sleep difficulties   . Visual disorder 2001   during hih blood pressure with pregnancy    Past Surgical History:  Procedure Laterality Date  . APPENDECTOMY  1998  . BILATERAL LOWER ARTERIAL DOPPLER  07/11/213   NORMAL ABI'S 1.1  .  BREAST EXCISIONAL BIOPSY Left   . NM MYOCAR PERF EJECTION FRACTION  09/30/2006   EF 81% , GLOBAL LEFT VENTRICULAR SYSTOLIC FX NORMAL; EXERCISE CAPACITY 8 METS.  Marland Kitchen RIGHT LOWER VENOUS DOPPLER  06/19/2012   NO EVIDENCE OF THROMBUS  . TREADMILL EXERCISE STRESS TEST  06/26/2012   negative ti adequate Bruce protocol- exercised for 11 min reaching 15.4 METS ACHEIVING APEAK HRAET RATE OF  185 REPRESENTING A 105% OF HER AGE-PREDICTED MAXIUM HEART RATE, RECVERED IN 6 MINUTES WITH RESTING HEART RATE OF 97  . US ECHOCARDIOGRAPHY  06/24/2012   EF =>55%, NORMAL  LEFT VENTRICULAR WALL, NORMAL LEFT ATRIAL SIZE,mild mitral regurg, mild tricuspid regurg.  Marland Kitchen US ECHOCARDIOGRAPHY  09/30/2006   EF=>55% LEFT VENTRICULAR WALL NORMAL, mild mitral regurgitation, mild tricuspid regurg.    Current Meds  Medication Sig  . Estradiol-Norethindrone Acet (MIMVEY PO) Take 1 tablet by mouth daily.  Marland Kitchen lisinopril (PRINIVIL,ZESTRIL) 2.5 MG tablet TAKE 1 TABLET BY MOUTH ONCE DAILY  . pantoprazole (PROTONIX) 40 MG tablet Take 1 tablet (40 mg total) by mouth daily.  Marland Kitchen zolpidem (AMBIEN) 10 MG tablet Take 10 mg by mouth at bedtime.   . [DISCONTINUED] lisinopril (PRINIVIL,ZESTRIL) 2.5 MG tablet TAKE 1 TABLET BY MOUTH ONCE DAILY (NEEDS  OFFICE  VISIT)  . [DISCONTINUED] mupirocin nasal ointment (BACTROBAN NASAL) 2 % Place 1 application into the nose 2 (two) times daily. Give enough for 10 days  . [DISCONTINUED] pantoprazole (PROTONIX) 40 MG tablet Take 1 tablet (40 mg total) by mouth daily. KEEP OV.    No Known Allergies  Social History   Tobacco Use  . Smoking status: Never Smoker  . Smokeless tobacco: Never Used  Substance Use Topics  . Alcohol use: Yes  . Drug use: No   Social History   Social History Narrative   She is a married (remarried) mother of 3 children 2 from her first marriage they're aged 97 and 24. She has a 9 year old son from her second marriage. 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   Is very active and works full-time As well as exercises.   She never smoked. Social alcohol.    family history includes Arrhythmia in her mother; Hypertension in her maternal grandmother and mother; Stroke in her maternal grandmother.  Wt Readings from Last 3 Encounters:  09/08/18 129 lb 12.8 oz (58.9 kg)  10/04/17  127 lb (57.6 kg)  02/08/17 132 lb (59.9 kg)    PHYSICAL EXAM BP 123/82   Pulse 70   Ht 5\' 4"  (1.626 m)   Wt 129 lb 12.8 oz (58.9 kg)   BMI 22.28 kg/m  Physical Exam  Constitutional: She is oriented to person, place, and time. She appears well-developed and well-nourished. No distress.  Healthy-appearing.  Well-groomed  HENT:  Head: Normocephalic and atraumatic.  Eyes: Conjunctivae and EOM are normal.  Neck: Normal range of motion. Neck supple. No hepatojugular reflux and no JVD present. Carotid bruit is not present.  Cardiovascular: Normal rate, regular rhythm, normal heart sounds, intact distal pulses and normal pulses.  No extrasystoles are present. PMI is not displaced. Exam reveals no gallop and no friction rub.  No murmur heard. Pulmonary/Chest: Breath sounds normal. No respiratory distress. She has no wheezes. She has no rales.  Abdominal: Soft. Bowel sounds are normal. She exhibits no distension. There is no tenderness. There is no rebound.  No HSM  Musculoskeletal: Normal range of motion. She exhibits no  edema.  Neurological: She is alert and oriented to person, place, and time. No cranial nerve deficit.  Psychiatric: She has a normal mood and affect. Her behavior is normal. Judgment and thought content normal.  Vitals reviewed.    Adult ECG Report  Rate: 70 ;  Rhythm: normal sinus rhythm and Normal axis, intervals and durations.;   Narrative Interpretation: Normal EKG   Other studies Reviewed: Additional studies/ records that were reviewed today include:  Recent Labs: Due to be checked soon by Dr. Henderson Cloud in the next month or so.   ASSESSMENT / PLAN: Problem List Items Addressed This Visit    Essential hypertension (Chronic)    Blood pressure seems to be well controlled on low-dose ACE inhibitor.  No need to titrate further.      Relevant Medications   lisinopril (PRINIVIL,ZESTRIL) 2.5 MG tablet   Other Relevant Orders   EKG 12-Lead (Completed)   Heart  palpitations - Primary (Chronic)    Palpitations are stable.  Not requiring beta-blocker.  Has not had issues in over 2 years now.  Staying adequately hydrated.      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  EF distorted by most recent echocardiogram. Continue on stable dose of ACE inhibitor. No diuretic requirement      Relevant Medications   lisinopril (PRINIVIL,ZESTRIL) 2.5 MG tablet   Other Relevant Orders   EKG 12-Lead (Completed)      Current medicines are reviewed at length with the patient today.  (+/- concerns) none The following changes have been made:  None  Patient Instructions  NO MEDICATION CHANGES    PLEASE HAVE  GYN- SEND COPY OF LABS - CHOLESTEROL     Your physician wants you to follow-up in 18 MONTHS WITH DR HARDING. You will receive a reminder letter in the mail two months in advance. If you don't receive a letter, please call our office to schedule the follow-up appointment.   If you need a refill on your cardiac medications before your next appointment, please call your pharmacy.     Studies Ordered:   Orders Placed This Encounter  Procedures  . EKG 12-Lead      Bryan Lemma, M.D., M.S. Interventional Cardiologist   Pager # [phone] Phone # [phone] [address] [place], [place] [address]   Thank you for choosing [facility] at [facility]!!

## 2018-09-08 NOTE — Patient Instructions (Addendum)
NO MEDICATION CHANGES    PLEASE HAVE  GYN- SEND COPY OF LABS - CHOLESTEROL     Your physician wants you to follow-up in 18 MONTHS WITH DR HARDING. You will receive a reminder letter in the mail two months in advance. If you don't receive a letter, please call our office to schedule the follow-up appointment.   If you need a refill on your cardiac medications before your next appointment, please call your pharmacy.

## 2018-09-09 ENCOUNTER — Encounter: Payer: Self-pay | Admitting: Cardiology

## 2018-09-09 NOTE — Assessment & Plan Note (Signed)
She continues to be asymptomatic with no active symptoms.  EF distorted by most recent echocardiogram. Continue on stable dose of ACE inhibitor. No diuretic requirement

## 2018-09-09 NOTE — Assessment & Plan Note (Signed)
Palpitations are stable.  Not requiring beta-blocker.  Has not had issues in over 2 years now.  Staying adequately hydrated.

## 2018-09-09 NOTE — Assessment & Plan Note (Signed)
Blood pressure seems to be well controlled on low-dose ACE inhibitor.  No need to titrate further.

## 2018-12-17 ENCOUNTER — Other Ambulatory Visit: Payer: Self-pay | Admitting: Cardiology

## 2019-02-23 ENCOUNTER — Other Ambulatory Visit: Payer: Self-pay | Admitting: Obstetrics and Gynecology

## 2019-02-23 DIAGNOSIS — Z1231 Encounter for screening mammogram for malignant neoplasm of breast: Secondary | ICD-10-CM

## 2019-03-24 ENCOUNTER — Ambulatory Visit: Payer: BC Managed Care – PPO

## 2019-05-11 ENCOUNTER — Other Ambulatory Visit: Payer: Self-pay

## 2019-05-11 ENCOUNTER — Ambulatory Visit
Admission: RE | Admit: 2019-05-11 | Discharge: 2019-05-11 | Disposition: A | Discharge status: Home / Self Care | Payer: BC Managed Care – PPO | Source: Ambulatory Visit | Attending: Obstetrics and Gynecology | Admitting: Obstetrics and Gynecology

## 2019-05-11 DIAGNOSIS — Z1231 Encounter for screening mammogram for malignant neoplasm of breast: Secondary | ICD-10-CM

## 2019-07-27 ENCOUNTER — Other Ambulatory Visit: Payer: Self-pay

## 2019-07-27 ENCOUNTER — Encounter: Payer: Self-pay | Admitting: Nurse Practitioner

## 2019-07-27 ENCOUNTER — Ambulatory Visit (INDEPENDENT_AMBULATORY_CARE_PROVIDER_SITE_OTHER): Payer: BC Managed Care – PPO | Admitting: Nurse Practitioner

## 2019-07-27 DIAGNOSIS — L03031 Cellulitis of right toe: Secondary | ICD-10-CM

## 2019-07-27 MED ORDER — SULFAMETHOXAZOLE-TRIMETHOPRIM 800-160 MG PO TABS: 1.0000 | ORAL_TABLET | Freq: Two times a day (BID) | ORAL | 0 refills | Status: DC

## 2019-07-27 NOTE — Progress Notes (Signed)
   Virtual Visit via telephone Note Due to COVID-19 pandemic this visit was conducted virtually. This visit type was conducted due to national recommendations for restrictions regarding the COVID-19 Pandemic (e.g. social distancing, sheltering in place) in an effort to limit this patient's exposure and mitigate transmission in our community. All issues noted in this document were discussed and addressed.  A physical exam was not performed with this format.  I connected with Kathryn Horton on 07/27/19 at 10:00 by telephone and verified that I am speaking with the correct person using two identifiers. Kathryn Horton is currently located at home and no one is currently with her during visit. The provider, Mary-Margaret Hassell Done, FNP is located in their office at time of visit.  I discussed the limitations, risks, security and privacy concerns of performing an evaluation and management service by telephone and the availability of in person appointments. I also discussed with the patient that there may be a patient responsible charge related to this service. The patient expressed understanding and agreed to proceed.   History and Present Illness:   Chief Complaint: Toe Pain   HPI Patient wanted a televisit today. She stated that she has 2 new puppies with very sharp teeth. One of them was biting on her toes and bit down into the side of her right great toe. Now the around the nail bed is red swollen and tender to the touch.    Review of Systems  Constitutional: Negative.   Respiratory: Negative.   Cardiovascular: Negative.   Genitourinary: Negative.   Psychiatric/Behavioral: Negative.   All other systems reviewed and are negative.    Observations/Objective: Alert and oriented- answers all questions appropriately No distress  Assessment and Plan: Kathryn Horton in today with chief complaint of Toe Pain   1. Paronychia of great toe, right Soak in epsom salt BID  Keep clean dry and covered. - sulfamethoxazole-trimethoprim (BACTRIM DS) 800-160 MG tablet; Take 1 tablet by mouth 2 (two) times daily.  Dispense: 20 tablet; Refill: 0    Follow Up Instructions: prn    I discussed the assessment and treatment plan with the patient. The patient was provided an opportunity to ask questions and all were answered. The patient agreed with the plan and demonstrated an understanding of the instructions.   The patient was advised to call back or seek an in-person evaluation if the symptoms worsen or if the condition fails to improve as anticipated.  The above assessment and management plan was discussed with the patient. The patient verbalized understanding of and has agreed to the management plan. Patient is aware to call the clinic if symptoms persist or worsen. Patient is aware when to return to the clinic for a follow-up visit. Patient educated on when it is appropriate to go to the emergency department.   Time call ended:  1017  I provided 17 minutes of non-face-to-face time during this encounter.    Mary-Margaret Hassell Done, FNP

## 2019-08-06 ENCOUNTER — Encounter: Payer: Self-pay | Admitting: Nurse Practitioner

## 2019-08-06 ENCOUNTER — Ambulatory Visit (INDEPENDENT_AMBULATORY_CARE_PROVIDER_SITE_OTHER): Payer: BC Managed Care – PPO | Admitting: Nurse Practitioner

## 2019-08-06 ENCOUNTER — Telehealth: Payer: Self-pay | Admitting: Family Medicine

## 2019-08-06 ENCOUNTER — Other Ambulatory Visit: Payer: Self-pay

## 2019-08-06 DIAGNOSIS — S99929A Unspecified injury of unspecified foot, initial encounter: Secondary | ICD-10-CM

## 2019-08-06 DIAGNOSIS — L03031 Cellulitis of right toe: Secondary | ICD-10-CM

## 2019-08-06 NOTE — Progress Notes (Signed)
   Virtual Visit via telephone Note Due to COVID-19 pandemic this visit was conducted virtually. This visit type was conducted due to national recommendations for restrictions regarding the COVID-19 Pandemic (e.g. social distancing, sheltering in place) in an effort to limit this patient's exposure and mitigate transmission in our community. All issues noted in this document were discussed and addressed.  A physical exam was not performed with this format.  I connected with Kathryn Horton on 08/06/19 at 2:55 by telephone and verified that I am speaking with the correct person using two identifiers. Kathryn Horton is currently located at home and no one is currently with her during visit. The provider, Mary-Margaret Hassell Done, FNP is located in their office at time of visit.  I discussed the limitations, risks, security and privacy concerns of performing an evaluation and management service by telephone and the availability of in person appointments. I also discussed with the patient that there may be a patient responsible charge related to this service. The patient expressed understanding and agreed to proceed.   History and Present Illness:   Chief Complaint: injured great toe.  HPI Patient calls in stating that the dog bit her toe nail. She has been on bactrim for 8 days. It is some better. She has been soaking it in epsom salt.   Review of Systems  Constitutional: Negative for diaphoresis and weight loss.  Eyes: Negative for blurred vision, double vision and pain.  Respiratory: Negative for shortness of breath.   Cardiovascular: Negative for chest pain, palpitations, orthopnea and leg swelling.  Gastrointestinal: Negative for abdominal pain.  Skin: Negative for rash.  Neurological: Negative for dizziness, sensory change, loss of consciousness, weakness and headaches.  Endo/Heme/Allergies: Negative for polydipsia. Does not bruise/bleed easily.  Psychiatric/Behavioral:  Negative for memory loss. The patient does not have insomnia.   All other systems reviewed and are negative.    Observations/Objective: Alert and oriented- answers all questions appropriately No distress    Assessment and Plan: Right toe nail injury -finish bactrim Continue soalking in epsom  Follow Up Instructions: prn    I discussed the assessment and treatment plan with the patient. The patient was provided an opportunity to ask questions and all were answered. The patient agreed with the plan and demonstrated an understanding of the instructions.   The patient was advised to call back or seek an in-person evaluation if the symptoms worsen or if the condition fails to improve as anticipated.  The above assessment and management plan was discussed with the patient. The patient verbalized understanding of and has agreed to the management plan. Patient is aware to call the clinic if symptoms persist or worsen. Patient is aware when to return to the clinic for a follow-up visit. Patient educated on when it is appropriate to go to the emergency department.   Time call ended:  3:05  I provided 10 minutes of non-face-to-face time during this encounter.    Mary-Margaret Hassell Done, FNP

## 2019-08-06 NOTE — Telephone Encounter (Signed)
Pt is taking antibiotics as prescribed but she thinks that her nail may be impacted in to her toe and it may possibly need to be opened up. I offered patient appt in office but she wants to send a pic first for evaluation. Patient started a Mychart message while on the phone with me and we will view images when the message comes in.

## 2019-10-23 ENCOUNTER — Ambulatory Visit (INDEPENDENT_AMBULATORY_CARE_PROVIDER_SITE_OTHER): Payer: BC Managed Care – PPO | Admitting: Family Medicine

## 2019-10-23 ENCOUNTER — Encounter: Payer: Self-pay | Admitting: Family Medicine

## 2019-10-23 DIAGNOSIS — R431 Parosmia: Secondary | ICD-10-CM

## 2019-10-23 DIAGNOSIS — R4701 Aphasia: Secondary | ICD-10-CM

## 2019-10-23 NOTE — Progress Notes (Signed)
Virtual Visit via Telephone Note  I connected with Kathryn Horton on 10/28/19 at 8:31 AM by telephone and verified that I am speaking with the correct person using two identifiers. Kathryn Horton is currently located at home and nobody is currently with her during this visit. The provider, Loman Brooklyn, FNP is located in their office at time of visit.  I discussed the limitations, risks, security and privacy concerns of performing an evaluation and management service by telephone and the availability of in person appointments. I also discussed with the patient that there may be a patient responsible charge related to this service. The patient expressed understanding and agreed to proceed.  Subjective: PCP: Loman Brooklyn, FNP  Chief Complaint  Patient presents with  . Aphasia   Patient complains of aphasia stating that she has the words in her head but just cannot seem to say them.  She feels like it is almost like a stutter.  This has been going on for the past couple of months.  She has a very important job and during the current pandemic has attributed this to stress.  It has gotten so bad that her husband and parents have noticed it as well.  Additionally she is concerned that 3 or 4 days ago she started smelling something in her house that smelled scorched.  She states that she has torn the house apart and is unable to find where the smell is coming from.  Her husband and children do not smell it but she says that is not unusual to her as she has a very sensitive nose and is the person that can smell that the dog peed in the house as soon as they walk in when nobody else can.   ROS: Per HPI  Current Outpatient Medications:  .  Estradiol-Norethindrone Acet (MIMVEY PO), Take 1 tablet by mouth daily., Disp: , Rfl:  .  lisinopril (ZESTRIL) 2.5 MG tablet, TAKE 1 TABLET BY MOUTH ONCE DAILY Please schedule an appt for further refills 1st attempt, Disp: 90 tablet, Rfl: 0  .  pantoprazole (PROTONIX) 40 MG tablet, TAKE 1 TABLET BY MOUTH ONCE DAILY (NEEDS  OFFICE  VISIT), Disp: 90 tablet, Rfl: 4 .  zolpidem (AMBIEN) 10 MG tablet, Take 10 mg by mouth at bedtime. , Disp: , Rfl:   No Known Allergies Past Medical History:  Diagnosis Date  . GERD (gastroesophageal reflux disease)   . Hypertension in pregnancy, essential, postpartum condition    pregnancy induced - Post Partum Ecclampsia  . Palpitations 06/26/2012-07/09/2012   WORE MOINTOR- SHOWED NORMAL  SINUS - SINUS TACHYCARDIA 78 TO 143  . Postpartum cardiomyopathy 2001   Related to Post-partum Ecclampsia; Most Recent Echo EF greater than 55%, normal LV size and function. Mild MR with no MVP (previouly had functional MR)  . Seizures ([place]) 03/06/2000   on first visit to hospital after baby  . Sleep difficulties   . Visual disorder 2001   during hih blood pressure with pregnancy    Observations/Objective: A&O  No respiratory distress or wheezing audible over the phone Mood, judgement, and thought processes all WNL  Assessment and Plan: 1. Aphasia - Patient declined medication to help with stress and anxiety. - MR Brain W Wo Contrast; Future  2. Increased sensitivity of smell - MR Brain W Wo Contrast; Future   Follow Up Instructions:  I discussed the assessment and treatment plan with the patient. The patient was provided an opportunity to ask questions and  all were answered. The patient agreed with the plan and demonstrated an understanding of the instructions.   The patient was advised to call back or seek an in-person evaluation if the symptoms worsen or if the condition fails to improve as anticipated.  The above assessment and management plan was discussed with the patient. The patient verbalized understanding of and has agreed to the management plan. Patient is aware to call the clinic if symptoms persist or worsen. Patient is aware when to return to the clinic for a follow-up visit. Patient  educated on when it is appropriate to go to the emergency department.   Time call ended: 8:44 AM  I provided 15 minutes of non-face-to-face time during this encounter.  Deliah Boston, MSN, APRN, FNP-C [facility] 10/28/19

## 2019-10-24 ENCOUNTER — Other Ambulatory Visit: Payer: Self-pay | Admitting: Cardiology

## 2019-10-26 ENCOUNTER — Telehealth: Payer: Self-pay | Admitting: Family Medicine

## 2019-10-28 ENCOUNTER — Ambulatory Visit (HOSPITAL_COMMUNITY)
Admission: RE | Admit: 2019-10-28 | Discharge: 2019-10-28 | Disposition: A | Discharge status: Home / Self Care | Payer: BC Managed Care – PPO | Source: Ambulatory Visit | Attending: Family Medicine | Admitting: Family Medicine

## 2019-10-28 ENCOUNTER — Other Ambulatory Visit: Payer: Self-pay

## 2019-10-28 DIAGNOSIS — R4701 Aphasia: Secondary | ICD-10-CM | POA: Diagnosis present

## 2019-10-28 DIAGNOSIS — R431 Parosmia: Secondary | ICD-10-CM | POA: Diagnosis present

## 2019-10-28 LAB — POCT I-STAT CREATININE: Creatinine, Ser: 0.8 mg/dL (ref 0.44–1.00)

## 2019-10-28 MED ORDER — GADOBUTROL 1 MMOL/ML IV SOLN
6.0000 mL | Freq: Once | INTRAVENOUS | Status: AC | PRN
  Administered 2019-10-28: 6 mL via INTRAVENOUS

## 2019-10-29 ENCOUNTER — Encounter: Payer: Self-pay | Admitting: Family Medicine

## 2019-11-13 ENCOUNTER — Other Ambulatory Visit: Payer: Self-pay

## 2019-11-13 DIAGNOSIS — Z20822 Contact with and (suspected) exposure to covid-19: Secondary | ICD-10-CM

## 2019-11-15 LAB — NOVEL CORONAVIRUS, NAA: SARS-CoV-2, NAA: NOT DETECTED

## 2019-12-23 ENCOUNTER — Ambulatory Visit: Payer: BC Managed Care – PPO | Attending: Internal Medicine

## 2019-12-23 ENCOUNTER — Other Ambulatory Visit: Payer: Self-pay

## 2019-12-23 DIAGNOSIS — Z20822 Contact with and (suspected) exposure to covid-19: Secondary | ICD-10-CM

## 2019-12-24 LAB — NOVEL CORONAVIRUS, NAA: SARS-CoV-2, NAA: NOT DETECTED

## 2020-01-11 ENCOUNTER — Other Ambulatory Visit: Payer: Self-pay | Admitting: Cardiology

## 2020-01-25 ENCOUNTER — Other Ambulatory Visit: Payer: Self-pay | Admitting: Cardiology

## 2020-02-17 ENCOUNTER — Encounter: Payer: Self-pay | Admitting: Family Medicine

## 2020-03-01 ENCOUNTER — Other Ambulatory Visit: Payer: Self-pay | Admitting: Cardiology

## 2020-03-04 ENCOUNTER — Encounter: Payer: Self-pay | Admitting: Cardiology

## 2020-03-04 ENCOUNTER — Ambulatory Visit: Payer: BC Managed Care – PPO | Admitting: Cardiology

## 2020-03-04 ENCOUNTER — Other Ambulatory Visit: Payer: Self-pay

## 2020-03-04 VITALS — BP 110/70 | HR 94 | Temp 96.9°F | Ht 64.0 in | Wt 133.8 lb

## 2020-03-04 DIAGNOSIS — I10 Essential (primary) hypertension: Secondary | ICD-10-CM

## 2020-03-04 DIAGNOSIS — O903 Peripartum cardiomyopathy: Secondary | ICD-10-CM

## 2020-03-04 DIAGNOSIS — R002 Palpitations: Secondary | ICD-10-CM | POA: Diagnosis not present

## 2020-03-04 NOTE — Assessment & Plan Note (Signed)
Actually doing very well.  Now that she is exercising more, these palpitations are even less.  She is happy to not be on a medication to treat them, because they are no longer bothering her.

## 2020-03-04 NOTE — Assessment & Plan Note (Signed)
Blood pressure looks great on current dose of lisinopril.  She commented on how her mother's blood pressure is somewhat higher and requires a much higher dose of lisinopril plus another medication.  She says that overall her energy level is much better with the lisinopril than without.

## 2020-03-04 NOTE — Patient Instructions (Signed)
Medication Instructions:  No changes *If you need a refill on your cardiac medications before your next appointment, please call your pharmacy*   Lab Work: Not needed   Testing/Procedures: Not needed   Follow-Up: At [facility], you and your health needs are our priority.  As part of our continuing mission to provide you with exceptional heart care, we have created designated Provider Care Teams.  These Care Teams include your primary Cardiologist (physician) and Advanced Practice Providers (APPs -  Physician Assistants and Nurse Practitioners) who all work together to provide you with the care you need, when you need it.      Your next appointment:   18 to 24  month(s)  The format for your next appointment:   In Person  Provider:   Bryan Lemma, MD

## 2020-03-04 NOTE — Progress Notes (Signed)
Primary Care Provider: Gwenlyn Fudge, FNP Cardiologist: No primary care provider on file. Electrophysiologist: None  Clinic Note: Chief Complaint  Patient presents with  . Follow-up    86-month; feeling great.  No issues   HPI:    Bluma Sharion Grieves is a 54 y.o. 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  [name] is a former patient of Dr. Susa Griffins with a history of postpartum cardiomyopathy ~61 year old DAUGHTER.  She had functional MR at the time which is greatly improved.  Follow-u p acquisition improved LV function as well as mitral regurgitation.  She also had significant palpitations enzymes which have resolved.  Keyunna Simora Dingee was last seen on September 08, 2018.  She really had no cardiac events this time.  No heart failure symptoms no palpitations.  Felt much better after he started low-dose ACE inhibitor.  Felt [place] improved.  She noted differences if she missed a dose.  Very rare palpitations.  One a few seconds.  Recent Hospitalizations: None  Reviewed  CV studies:    The following studies were reviewed today: (if available, images/films reviewed: From Epic Chart or Care Everywhere) . None:   Interval History:   Zayda Jurnie Garritano returns today overall doing great.  She is in a great mood.  She says she has been exercising more weight and feels very healthy.  She is lost a little bit of weight but she had gained up.  She feels healthier.  She does not wonderful with this low-dose lisinopril.  No palpitations, no PND, orthopnea or edema.  No edema.  She has a hard time believing has been 20 years since she had her cardiomyopathy episode.  She has been doing wonderfully well.  CV Review of Symptoms (Summary) Cardiovascular ROS: no chest pain or dyspnea on exertion negative for - edema, irregular heartbeat, loss of consciousness, orthopnea,  palpitations, paroxysmal nocturnal dyspnea, rapid heart rate, shortness of breath or Near syncope, TIA/amaurosis fugax, claudication.  The patient does not have symptoms concerning for COVID-19 infection (fever, chills, cough, or new shortness of breath).  The patient is not practicing social distancing & Masking.   She had her first COVID-19 vaccine earlier this month and is due to have her second dose on March 29.   REVIEWED OF SYSTEMS   Review of Systems  Constitutional: Negative for malaise/fatigue.  HENT: Negative for congestion and nosebleeds.   Gastrointestinal: Negative for blood in stool and melena.  Genitourinary: Negative for hematuria.  Musculoskeletal: Negative for joint pain.  Neurological: Negative for dizziness.  Psychiatric/Behavioral: Negative.     I have reviewed and (if needed) personally updated the patient's problem list, medications, allergies, past medical and surgical history, social and family history.   PAST MEDICAL HISTORY   Past Medical History:  Diagnosis Date  . GERD (gastroesophageal reflux disease)   . Hypertension in pregnancy, essential, postpartum condition    pregnancy induced - Post Partum Ecclampsia  . Palpitations 06/26/2012-07/09/2012   WORE MOINTOR- SHOWED NORMAL  SINUS - SINUS TACHYCARDIA 78 TO 143  . Postpartum cardiomyopathy 2001   Related to Post-partum Ecclampsia; Most Recent Echo EF greater than 55%, normal LV size and function. Mild MR with no MVP (previouly had functional MR)  . Seizures (HCC) 03/06/2000   on first visit to hospital after baby  . Sleep difficulties   . Visual disorder 2001   during hih blood pressure with pregnancy    PAST  SURGICAL HISTORY   Past Surgical History:  Procedure Laterality Date  . APPENDECTOMY  1998  . BILATERAL LOWER ARTERIAL DOPPLER  07/11/213   NORMAL ABI'S 1.1  . BREAST EXCISIONAL BIOPSY Left   . NM MYOCAR PERF EJECTION FRACTION  09/30/2006   EF 81% , GLOBAL LEFT VENTRICULAR SYSTOLIC FX  NORMAL; EXERCISE CAPACITY 8 METS.  Marland Kitchen RIGHT LOWER VENOUS DOPPLER  06/19/2012   NO EVIDENCE OF THROMBUS  . TREADMILL EXERCISE STRESS TEST  06/26/2012   negative ti adequate Bruce protocol- exercised for 11 min reaching 15.4 METS ACHEIVING APEAK HRAET RATE OF 185 REPRESENTING A 105% OF HER AGE-PREDICTED MAXIUM HEART RATE, RECVERED IN 6 MINUTES WITH RESTING HEART RATE OF 97  . US ECHOCARDIOGRAPHY  06/24/2012   EF =>55%, NORMAL  LEFT VENTRICULAR WALL, NORMAL LEFT ATRIAL SIZE,mild mitral regurg, mild tricuspid regurg.  Marland Kitchen US ECHOCARDIOGRAPHY  09/30/2006   EF=>55% LEFT VENTRICULAR WALL NORMAL, mild mitral regurgitation, mild tricuspid regurg.    MEDICATIONS/ALLERGIES   Current Meds  Medication Sig  . Estradiol-Norethindrone Acet (MIMVEY PO) Take 1 tablet by mouth daily.  Marland Kitchen lisinopril (ZESTRIL) 2.5 MG tablet Take 1 tablet (2.5 mg total) by mouth daily. KEEP OV.  . pantoprazole (PROTONIX) 40 MG tablet Take 1 tablet by mouth once daily  . zolpidem (AMBIEN) 10 MG tablet Take 10 mg by mouth at bedtime.     No Known Allergies    SOCIAL HISTORY/FAMILY HISTORY   Reviewed in Epic:  Pertinent findings: Now back to being an empty Jam[place] Neighbor will be under child now 70 years old and in college.  Has increased her exercise level.  Walking about 3 miles a day.   Should be completing second Covid vaccine on 03/07/2020  OBJCTIVE -PE, EKG, labs   Wt Readings from Last 3 Encounters:  03/04/20 133 lb 12.8 oz (60.7 kg)  09/08/18 129 lb 12.8 oz (58.9 kg)  10/04/17 127 lb (57.6 kg)    Physical Exam: BP 110/70   Pulse 94   Temp (!) 96.9 F (36.1 C)   Ht 5\' 4"  (1.626 m)   Wt 133 lb 12.8 oz (60.7 kg)   SpO2 99%   BMI 22.97 kg/m  Physical Exam  Constitutional: She is oriented to person, place, and time. She appears well-developed and well-nourished. No distress.  Healthy-appearing.  Well-groomed.  HENT:  Head: Normocephalic and atraumatic.  Neck: No hepatojugular reflux and no JVD present. Carotid  bruit is not present.  Cardiovascular: Normal rate, regular rhythm, normal heart sounds and intact distal pulses.  No extrasystoles are present. PMI is not displaced. Exam reveals no gallop and no friction rub.  No murmur heard. Pulmonary/Chest: Effort normal and breath sounds normal. No respiratory distress. She has no wheezes. She has no rales.  Abdominal:  No HSM  Musculoskeletal:        General: No edema. Normal range of motion.     Cervical back: Normal range of motion and neck supple.  Neurological: She is alert and oriented to person, place, and time.  Psychiatric: She has a normal mood and affect. Her behavior is normal. Judgment and thought content normal.  Vitals reviewed.    Adult ECG Report  Rate: 94 ;  Rhythm: normal sinus rhythm and Normal axis, intervals and durations.  Borderline low voltage.;   Narrative Interpretation: Normal EKG.  Recent Labs: Labs usually followed by PCP.  Is due to have labs checked within the next month or so.  Has had stable lipid panel. No results  found for: CHOL, HDL, LDLCALC, LDLDIRECT, TRIG, CHOLHDL Lab Results  Component Value Date   CREATININE 0.80 10/28/2019   No results found for: TSH  ASSESSMENT/PLAN    Problem List Items Addressed This Visit    Essential hypertension - Primary (Chronic)    Blood pressure looks great on current dose of lisinopril.  She commented on how her mother's blood pressure is somewhat higher and requires a much higher dose of lisinopril plus another medication.  She says that overall her energy level is much better with the lisinopril than without.      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  EF is back to normal based on repeat echo.  Will maintain on lisinopril for blood pressure control.  No signs or symptoms of heart failure.      Heart palpitations (Chronic)    Actually doing very well.  Now that she is exercising more,  these palpitations are even less.  She is happy to not be on a medication to treat them, because they are no longer bothering her.      Relevant Orders   EKG 12-Lead       COVID-19 Education: The signs and symptoms of COVID-19 were discussed with the patient and how to seek care for testing (follow up with PCP or arrange E-visit).   The importance of social distancing was discussed today.  I spent a total of 16minutes with the patient. >  50% of the time was spent in direct patient consultation.  Additional time spent with chart review  / charting (studies, outside notes, etc): 5 Total Time: None min   Current medicines are reviewed at length with the patient today.  (+/- concerns) none  Notice: This dictation was prepared with Dragon dictation along with smaller phrase technology. Any transcriptional errors that result from this process are unintentional and may not be corrected upon review.  Patient Instructions / Medication Changes & Studies & Tests Ordered   Patient Instructions  Medication Instructions:  No changes *If you need a refill on your cardiac medications before your next appointment, please call your pharmacy*   Lab Work: Not needed   Testing/Procedures: Not needed   Follow-Up: At [facility], you and your health needs are our priority.  As part of our continuing mission to provide you with exceptional heart care, we have created designated Provider Care Teams.  These Care Teams include your primary Cardiologist (physician) and Advanced Practice Providers (APPs -  Physician Assistants and Nurse Practitioners) who all work together to provide you with the care you need, when you need it.      Your next appointment:   18 to 24  month(s)  The format for your next appointment:   In Person  Provider:   Glenetta Hew, MD       Studies Ordered:   Orders Placed This Encounter  Procedures  . EKG 12-Lead     Glenetta Hew, M.D.,  M.S. Interventional Cardiologist   Pager # [phone] Phone # [phone] [address] [place], [place] [address]   Thank you for choosing Heart[facility] at [facility]!!

## 2020-03-04 NOTE — Assessment & Plan Note (Signed)
Intensivist resolved.  EF is back to normal based on repeat echo.  Will maintain on lisinopril for blood pressure control.  No signs or symptoms of heart failure.

## 2020-03-15 ENCOUNTER — Ambulatory Visit (INDEPENDENT_AMBULATORY_CARE_PROVIDER_SITE_OTHER): Payer: BC Managed Care – PPO | Admitting: *Deleted

## 2020-03-15 ENCOUNTER — Other Ambulatory Visit: Payer: Self-pay

## 2020-03-15 DIAGNOSIS — Z111 Encounter for screening for respiratory tuberculosis: Secondary | ICD-10-CM

## 2020-03-15 NOTE — Progress Notes (Signed)
PPD placed right forearm. Scheduled to read 03/18/20.

## 2020-03-18 ENCOUNTER — Other Ambulatory Visit: Payer: Self-pay

## 2020-03-18 ENCOUNTER — Ambulatory Visit: Payer: BC Managed Care – PPO

## 2020-03-18 LAB — TB SKIN TEST
Induration: 0 mm
TB Skin Test: NEGATIVE

## 2020-03-31 ENCOUNTER — Other Ambulatory Visit: Payer: Self-pay | Admitting: Cardiology

## 2020-03-31 NOTE — Telephone Encounter (Signed)
Rx(s) sent to pharmacy electronically.  

## 2020-04-13 ENCOUNTER — Other Ambulatory Visit: Payer: Self-pay | Admitting: Obstetrics and Gynecology

## 2020-04-13 DIAGNOSIS — Z1231 Encounter for screening mammogram for malignant neoplasm of breast: Secondary | ICD-10-CM

## 2020-05-13 ENCOUNTER — Other Ambulatory Visit: Payer: Self-pay

## 2020-05-13 ENCOUNTER — Ambulatory Visit
Admission: RE | Admit: 2020-05-13 | Discharge: 2020-05-13 | Disposition: A | Discharge status: Home / Self Care | Payer: BC Managed Care – PPO | Source: Ambulatory Visit | Attending: Obstetrics and Gynecology | Admitting: Obstetrics and Gynecology

## 2020-05-13 DIAGNOSIS — Z1231 Encounter for screening mammogram for malignant neoplasm of breast: Secondary | ICD-10-CM

## 2020-07-13 ENCOUNTER — Other Ambulatory Visit: Payer: Self-pay | Admitting: Cardiology

## 2020-12-23 ENCOUNTER — Ambulatory Visit (INDEPENDENT_AMBULATORY_CARE_PROVIDER_SITE_OTHER): Payer: BC Managed Care – PPO | Admitting: Nurse Practitioner

## 2020-12-23 ENCOUNTER — Encounter: Payer: Self-pay | Admitting: Nurse Practitioner

## 2020-12-23 ENCOUNTER — Other Ambulatory Visit: Payer: Self-pay

## 2020-12-23 DIAGNOSIS — Z7689 Persons encountering health services in other specified circumstances: Secondary | ICD-10-CM

## 2020-12-23 DIAGNOSIS — R002 Palpitations: Secondary | ICD-10-CM | POA: Diagnosis not present

## 2020-12-23 DIAGNOSIS — I10 Essential (primary) hypertension: Secondary | ICD-10-CM

## 2020-12-23 NOTE — Progress Notes (Signed)
Virtual Visit via telephone Note Due to COVID-19 pandemic this visit was conducted virtually. This visit type was conducted due to national recommendations for restrictions regarding the COVID-19 Pandemic (e.g. social distancing, sheltering in place) in an effort to limit this patient's exposure and mitigate transmission in our community. All issues noted in this document were discussed and addressed.  A physical exam was not performed with this format.  I connected with Kathryn Horton on 12/23/20 at  8 oh 10 AM by telephone and verified that I am speaking with the correct person using two identifiers. Kathryn Horton is currently located at home and no one is currently with patient during visit. The provider, Daryll Drown, NP is located in their office at time of visit.  I discussed the limitations, risks, security and privacy concerns of performing an evaluation and management service by telephone and the availability of in person appointments. I also discussed with the patient that there may be a patient responsible charge related to this service. The patient expressed understanding and agreed to proceed.   History and Present Illness:  HPI  Pt presents for follow up of hypertension. Patient was diagnosed in 2015. The patient is tolerating the medication well without side effects. Compliance with treatment has been good; including taking medication as directed , maintains a healthy diet and regular exercise regimen , and following up as directed.  Current medication lisinopril 2.5 mg tablet.  Palpitations: Patient complains of no new symptoms occurring and has not had the need of medication.           Review of Systems  Constitutional: Negative.   HENT: Negative.   Eyes: Negative.   Respiratory: Negative.   Cardiovascular: Negative.   Gastrointestinal: Negative.   Genitourinary: Negative.   Musculoskeletal: Negative.   Neurological: Negative.    Psychiatric/Behavioral: Negative.   All other systems reviewed and are negative.    Observations/Objective: Televisit  Assessment and Plan:  Essential hypertension Patient is reporting that essential hypertension is well controlled on current dose of lisinopril.  No changes necessary.  Advised to continue to monitor blood pressure as needed, low-sodium healthy diet with exercise as tolerated.      Palpitations She is reporting that palpitations no longer bother her.  She is doing well and has no new concerns.    Establishing care with new doctor, encounter for Patient reports that she will continue with her OB/GYN for all physical exams until she stops getting Pap at the age of 27 and will come back to [facility] for yearly physical and labs.  Unable to obtain vital signs from patient, patient did not provide and has no blood pressure cuff at home to check current blood pressure but states blood pressure is well controlled on lisinopril 2.5 mg daily,and she pretty much knows when to get blood pressure checked with any signs of hypo or hypertension. Patient is going  to repeat creatinine per last clinic visit with MD. patient reports creatinine was elevated and care MD wanted her to repeat creatinine.  No labs found in epic as of today.  Patient advised to send in any prior health maintenance assessments and results.  Patient also reports concerns with current weight gain.  Patient has gained 15 pounds since July and is concerned about what else can be done.  Patient reports she has not done anything differently in the past few months but has not been exercising as much.  Patient reports during her  yearly physical provider reassesed her thyroid and blood work to make sure that weight gain is not due to hormone imbalance.  I have no labs to go by today but will recheck and get back to patient with any abnormal results. Lab orders completed. Follow-up as  needed.   Essential hypertension Patient is reporting that essential hypertension is well controlled on current dose of lisinopril.  No changes necessary.  Advised to continue to monitor blood pressure as needed, low-sodium healthy diet with exercise as tolerated.      Palpitations She is reporting that palpitations no longer bother her.  She is doing well and has no new concerns.    Establishing care with new doctor, encounter for Patient reports that she will continue with her OB/GYN for all physical until she stops getting Pap at the age of 89 and will come back to [facility] for yearly physical and labs.  Unable to obtain vital signs from patient, patient did not provide and has no blood pressure cuff at home to check current blood pressure but stays blood pressure is well controlled on lisinopril and she pretty much knows when to get blood pressure checked with any signs of hypo or hyper tension. Patient is going to clinic to repeat creatinine per last clinic visit patient reports creatinine was elevated and care giver wanted her to repeat creatinine.  No labs found in epic as of today.  Patient advised to send in any prior health maintenance assessments and results.  Patient also reports concerns with current weight gain.  Patient has gained 15 pounds since July and is concerned about what else can be done.  Patient reports she has not done anything differently in the past few months but has not been exercising as much.  Patient reports during her yearly physical provider reassess her thyroid and blood work to make sure that weight gain is not due to hormone imbalance.  I have no labs to go by today but will recheck and get back to patient with any abnormal results. Lab orders completed. Follow-up as needed.     Follow Up Instructions: Follow-up with worsening unresolved symptoms.    I discussed the assessment and treatment plan with the patient. The  patient was provided an opportunity to ask questions and all were answered. The patient agreed with the plan and demonstrated an understanding of the instructions.   The patient was advised to call back or seek an in-person evaluation if the symptoms worsen or if the condition fails to improve as anticipated.  The above assessment and management plan was discussed with the patient. The patient verbalized understanding of and has agreed to the management plan. Patient is aware to call the clinic if symptoms persist or worsen. Patient is aware when to return to the clinic for a follow-up visit. Patient educated on when it is appropriate to go to the emergency department.   Time call ended: 8:22 AM  I provided 12 minutes of non-face-to-face time during this encounter.    Daryll Drown, NP

## 2020-12-23 NOTE — Assessment & Plan Note (Signed)
She is reporting that palpitations no longer bother her.  She is doing well and has no new concerns.

## 2020-12-23 NOTE — Assessment & Plan Note (Addendum)
Patient reports that she will continue with her OB/GYN for all physical until she stops getting Pap at the age of 29 and will come back to [facility] for yearly physical and labs.  Unable to obtain vital signs from patient, patient did not provide and has no blood pressure cuff at home to check current blood pressure but stays blood pressure is well controlled on lisinopril and she pretty much knows when to get blood pressure checked with any signs of hypo or hyper tension. Patient is going to clinic to repeat creatinine per last clinic visit patient reports creatinine was elevated and care giver wanted her to repeat creatinine.  No labs found in epic as of today.  Patient advised to send in any prior health maintenance assessments and results.  Patient also reports concerns with current weight gain.  Patient has gained 15 pounds since July and is concerned about what else can be done.  Patient reports she has not done anything differently in the past few months but has not been exercising as much.  Patient reports during her yearly physical provider reassess her thyroid and blood work to make sure that weight gain is not due to hormone imbalance.  I have no labs to go by today but will recheck and get back to patient with any abnormal results. Lab orders completed. Follow-up as needed.

## 2020-12-23 NOTE — Patient Instructions (Signed)

## 2020-12-23 NOTE — Assessment & Plan Note (Signed)
Patient is reporting that essential hypertension is well controlled on current dose of lisinopril.  No changes necessary.  Advised to continue to monitor blood pressure as needed, low-sodium healthy diet with exercise as tolerated.

## 2020-12-24 LAB — CREATININE WITH EST GFR
Creatinine, Ser: 0.91 mg/dL (ref 0.57–1.00)
GFR calc Af Amer: 83 mL/min/{1.73_m2} (ref >59)
GFR calc non Af Amer: 72 mL/min/{1.73_m2} (ref >59)

## 2021-04-01 ENCOUNTER — Other Ambulatory Visit: Payer: Self-pay | Admitting: Cardiology

## 2021-04-11 ENCOUNTER — Other Ambulatory Visit: Payer: Self-pay | Admitting: Obstetrics and Gynecology

## 2021-04-11 DIAGNOSIS — Z1231 Encounter for screening mammogram for malignant neoplasm of breast: Secondary | ICD-10-CM

## 2021-06-02 ENCOUNTER — Ambulatory Visit
Admission: RE | Admit: 2021-06-02 | Discharge: 2021-06-02 | Disposition: A | Discharge status: Home / Self Care | Payer: BC Managed Care – PPO | Source: Ambulatory Visit | Attending: Obstetrics and Gynecology | Admitting: Obstetrics and Gynecology

## 2021-06-02 ENCOUNTER — Other Ambulatory Visit: Payer: Self-pay

## 2021-06-02 DIAGNOSIS — Z1231 Encounter for screening mammogram for malignant neoplasm of breast: Secondary | ICD-10-CM

## 2021-07-24 ENCOUNTER — Other Ambulatory Visit: Payer: Self-pay | Admitting: Cardiology

## 2021-07-31 ENCOUNTER — Encounter: Payer: Self-pay | Admitting: Nurse Practitioner

## 2021-07-31 ENCOUNTER — Ambulatory Visit (INDEPENDENT_AMBULATORY_CARE_PROVIDER_SITE_OTHER): Payer: BC Managed Care – PPO | Admitting: Nurse Practitioner

## 2021-07-31 VITALS — BP 115/78 | HR 113 | Temp 97.4°F

## 2021-07-31 DIAGNOSIS — R059 Cough, unspecified: Secondary | ICD-10-CM | POA: Diagnosis not present

## 2021-07-31 MED ORDER — METHYLPREDNISOLONE ACETATE 40 MG/ML IJ SUSP
80.0000 mg | Freq: Once | INTRAMUSCULAR | Status: AC
  Administered 2021-07-31: 80 mg via INTRAMUSCULAR

## 2021-07-31 MED ORDER — PREDNISONE 20 MG PO TABS: 40.0000 mg | ORAL_TABLET | Freq: Every day | ORAL | 0 refills | Status: AC

## 2021-07-31 MED ORDER — HYDROCODONE BIT-HOMATROP MBR 5-1.5 MG/5ML PO SOLN: 5.0000 mL | Freq: Four times a day (QID) | ORAL | 0 refills | Status: DC | PRN

## 2021-07-31 NOTE — Progress Notes (Signed)
   Subjective:    Patient ID: Kathryn Horton, female    DOB: [birth year], 55 y.o.   MRN: [id number]  Chief Complaint: Cough (X 5 days . Neg covid test on wed of last week but patient has gotten worse. Patient went to urgent care last night and was given cough medication but it is not helping ), Sore Throat, and Shortness of Breath   HPI Patient comes in c/o cough for 5 days. She has had several negative covid test. Cough has not gotten any better. Went to urgent care yesterday. They gave her cough syrup. Slight sob. No fever.     Review of Systems  Constitutional:  Negative for chills, fatigue and fever.  HENT:  Positive for postnasal drip and rhinorrhea.   Respiratory:  Positive for cough and shortness of breath.   Musculoskeletal:  Negative for arthralgias and myalgias.  Neurological:  Negative for headaches.      Objective:   Physical Exam Vitals reviewed.  Constitutional:      Appearance: She is well-developed.  HENT:     Nose: Congestion present.     Mouth/Throat:     Mouth: Mucous membranes are moist.  Cardiovascular:     Rate and Rhythm: Normal rate and regular rhythm.  Pulmonary:     Effort: Pulmonary effort is normal.     Breath sounds: Normal breath sounds. No wheezing, rhonchi or rales.  Musculoskeletal:     Cervical back: Normal range of motion and neck supple.  Neurological:     Mental Status: She is alert.    BP 115/78   Pulse (!) 113   Temp (!) 97.4 F (36.3 C) (Temporal)   SpO2 97%        Assessment & Plan:   Kathryn Horton in today with chief complaint of Cough (X 5 days . Neg covid test on wed of last week but patient has gotten worse. Patient went to urgent care last night and was given cough medication but it is not helping ), Sore Throat, and Shortness of Breath   1. Cough Force fliuds Rest run humidifier RTO prn - Novel Coronavirus, NAA ([facility]) Meds ordered this encounter  Medications   HYDROcodone bit-homatropine  (HYCODAN) 5-1.5 MG/5ML syrup    Sig: Take 5 mLs by mouth every 6 (six) hours as needed for cough.    Dispense:  120 mL    Refill:  0    Order Specific Question:   Supervising Provider    Answer:   Arville Care A [[id number]]   predniSONE (DELTASONE) 20 MG tablet    Sig: Take 2 tablets (40 mg total) by mouth daily with breakfast for 5 days. 2 po daily for 5 days    Dispense:  10 tablet    Refill:  0    Order Specific Question:   Supervising Provider    Answer:   Arville Care A [[id number]]   methylPREDNISolone acetate (DEPO-MEDROL) injection 80 mg     The above assessment and management plan was discussed with the patient. The patient verbalized understanding of and has agreed to the management plan. Patient is aware to call the clinic if symptoms persist or worsen. Patient is aware when to return to the clinic for a follow-up visit. Patient educated on when it is appropriate to go to the emergency department.   Mary-Margaret Daphine Deutscher, FNP

## 2021-07-31 NOTE — Patient Instructions (Signed)

## 2021-08-01 LAB — SARS-COV-2, NAA 2 DAY TAT

## 2021-08-01 LAB — NOVEL CORONAVIRUS, NAA: SARS-CoV-2, NAA: NOT DETECTED

## 2021-10-28 ENCOUNTER — Other Ambulatory Visit: Payer: Self-pay | Admitting: Cardiology

## 2021-11-05 ENCOUNTER — Other Ambulatory Visit: Payer: Self-pay | Admitting: Cardiology

## 2022-02-02 ENCOUNTER — Other Ambulatory Visit: Payer: Self-pay | Admitting: Cardiology

## 2022-02-07 ENCOUNTER — Other Ambulatory Visit: Payer: Self-pay | Admitting: Cardiology

## 2022-03-11 ENCOUNTER — Other Ambulatory Visit: Payer: Self-pay | Admitting: Cardiology

## 2022-05-01 ENCOUNTER — Encounter: Payer: Self-pay | Admitting: Cardiology

## 2022-05-01 ENCOUNTER — Ambulatory Visit: Payer: BC Managed Care – PPO | Admitting: Cardiology

## 2022-05-01 VITALS — BP 122/78 | HR 68 | Ht 64.0 in | Wt 147.6 lb

## 2022-05-01 DIAGNOSIS — I10 Essential (primary) hypertension: Secondary | ICD-10-CM | POA: Diagnosis not present

## 2022-05-01 DIAGNOSIS — R002 Palpitations: Secondary | ICD-10-CM

## 2022-05-01 DIAGNOSIS — O903 Peripartum cardiomyopathy: Secondary | ICD-10-CM

## 2022-05-01 DIAGNOSIS — Z01818 Encounter for other preprocedural examination: Secondary | ICD-10-CM

## 2022-05-01 MED ORDER — LISINOPRIL 2.5 MG PO TABS: 2.5000 mg | ORAL_TABLET | Freq: Every day | ORAL | 3 refills | Status: DC

## 2022-05-01 NOTE — Patient Instructions (Addendum)
Medication Instructions:  No changes  *If you need a refill on your cardiac medications before your next appointment, please call your pharmacy*   Lab Work: Not needed    Testing/Procedures: Not needed   Follow-Up: At [facility], you and your health needs are our priority.  As part of our continuing mission to provide you with exceptional heart care, we have created designated Provider Care Teams.  These Care Teams include your primary Cardiologist (physician) and Advanced Practice Providers (APPs -  Physician Assistants and Nurse Practitioners) who all work together to provide you with the care you need, when you need it.     Your next appointment:   18 to 24  month(s)  The format for your next appointment:   In Person  Provider:   None    Other Instructions

## 2022-05-01 NOTE — Progress Notes (Signed)
Primary Care Provider: Everlene Farrier, MD Cardiologist: Glenetta Hew, MD Electrophysiologist: None  Clinic Note: Chief Complaint  Patient presents with   Follow-up    Biennial.  Doing well.    ===================================  ASSESSMENT/PLAN   Problem List Items Addressed This Visit       Cardiology Problems   Essential hypertension (Chronic)    BP looks well controlled on current dose of lisinopril.  Due for refill. No changes. Continue low-sodium diet.  Hope to lose weight.      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  EF back to normal.  No CHF Remains on low-dose ACE inhibitor for BP control.      Relevant Medications   lisinopril (ZESTRIL) 2.5 MG tablet     Other   Pre-op evaluation (Chronic)   Palpitations - Primary (Chronic)    No longer bothersome to her.  Hold off on beta-blocker      Relevant Orders   EKG 12-Lead (Completed)   ===================================  HPI:    Kathryn Horton is a 56 y.o. female with a PMH below who presents today for biennial follow-up for history of Post-partum CM at the request of Kathryn Horton, Kathryn Horton.  Kathryn Horton is a former patient of Dr. Terance Ice with a history of Resolved Postpartum Cardiomyopathy - was from the pregnancy of her now almost 22y/o daughter.   Kathryn Horton on March 04, 2020.  Feeling really well.  She had lost some the weight she gained back.  Was in a great mood.  Exercising more and feeling more healthy and energetic.  Blood pressure doing well.  Was only on low-dose lisinopril.  Hard to believe that he been 20 years since her diagnosis-meaning that her daughter was now 45 years old  Recent Hospitalizations: None  Reviewed  CV studies:    The following studies were reviewed  today: (if available, images/films reviewed: From Epic Chart or Care Everywhere) None:  Interval History:   Kathryn Horton returns today overall doing really well. She is having little bit of social issues because her father-in-law just passed away and now that her mother-in-law has been in and out of the hospital, probably most related to failure to thrive since the death of her husband.  This is because little bit of stress for [name] because she takes the last year of the due to taking care of her mother-in-law. She still exercises walking 3 to 4 miles a day 6 to 7 days a week unless she is traveling.  Unfortunately, she has been on quite a bit of weight having had to take a break from her exercise to care for her first father-in-law and mother-in-law.  She is open to get back into her exercise regimen and get back on her diet. She has not had any heart failure symptoms of PND orthopnea or edema.  No chest pain pressure or dyspnea with rest or exertion.  No rapid irregular heartbeats palpitations.  No syncope or near syncope, no TIA or amaurosis fugax.  No claudication.  REVIEWED OF SYSTEMS   Review of Systems  Constitutional:  Negative for malaise/fatigue and weight loss (Gained weight).  HENT:  Negative for congestion and sinus pain.   Respiratory:  Negative for cough and shortness of breath.  Cardiovascular:        Per HPI  Gastrointestinal:  Negative for blood in stool and melena.  Genitourinary:  Negative for hematuria.  Musculoskeletal:  Negative for joint pain and myalgias.  Neurological:  Negative for dizziness, focal weakness and headaches.  Endo/Heme/Allergies:  Negative for environmental allergies. Does not bruise/bleed easily.  Psychiatric/Behavioral:         Under some stress, but seems to be handling it well  All other systems reviewed and are negative.  I have reviewed and (if needed) personally updated the patient's problem list, medications, allergies, past  medical and surgical history, social and family history.   PAST MEDICAL HISTORY   Past Medical History:  Diagnosis Date   GERD (gastroesophageal reflux disease)    Hypertension in pregnancy, essential, postpartum condition    pregnancy induced - Post Partum Ecclampsia   MVP (mitral valve prolapse)    after pregnancy post partum eclampsis CHF   Palpitations 06/26/2012-07/09/2012   WORE MOINTOR- SHOWED NORMAL  SINUS - SINUS TACHYCARDIA 78 TO 143   Postpartum cardiomyopathy 12/11/1999   Related to Post-partum Ecclampsia; Most Recent Echo EF greater than 55%, normal LV size and function. Mild MR with no MVP (previouly had functional MR)   Seizures ([place]) 03/06/2000   on first visit to hospital after baby   Sleep difficulties    Visual disorder 12/11/1999   during hih blood pressure with pregnancy    PAST SURGICAL HISTORY   Past Surgical History:  Procedure Laterality Date   APPENDECTOMY  1998   BILATERAL LOWER ARTERIAL DOPPLER  07/11/213   NORMAL ABI'S 1.1   BREAST EXCISIONAL BIOPSY Left    DILATION AND CURETTAGE OF UTERUS     x2   MASS EXCISION Left 05/21/2022   Procedure: EXCISION MASS LEFT LABIA;  Surgeon: Johnathan Hausen, MD;  Location: [facility];  Service: General;  Laterality: Left;   NM MYOCAR PERF EJECTION FRACTION  09/30/2006   EF 81% , GLOBAL LEFT VENTRICULAR SYSTOLIC FX NORMAL; EXERCISE CAPACITY 8 METS.   RIGHT LOWER VENOUS DOPPLER  06/19/2012   NO EVIDENCE OF THROMBUS   TREADMILL EXERCISE STRESS TEST  06/26/2012   negative ti adequate Bruce protocol- exercised for 11 min reaching 15.4 METS ACHEIVING APEAK HRAET RATE OF 185 REPRESENTING A 105% OF HER AGE-PREDICTED MAXIUM HEART RATE, RECVERED IN 6 MINUTES WITH RESTING HEART RATE OF 97   TUBAL LIGATION     tubal ligation reversal     US ECHOCARDIOGRAPHY  06/24/2012   EF =>55%, NORMAL  LEFT VENTRICULAR WALL, NORMAL LEFT ATRIAL SIZE,mild mitral regurg, mild tricuspid regurg.   US ECHOCARDIOGRAPHY  09/30/2006   EF=>55% LEFT  VENTRICULAR WALL NORMAL, mild mitral regurgitation, mild tricuspid regurg.    Immunization History  Administered Date(s) Administered   PPD Test 03/29/2015, 03/15/2020    MEDICATIONS/ALLERGIES   Current Meds  Medication Sig   [DISCONTINUED] lisinopril (ZESTRIL) 2.5 MG tablet Take 1 tablet (2.5 mg total) by mouth daily.   [DISCONTINUED] pantoprazole (PROTONIX) 40 MG tablet Take 1 tablet (40 mg total) by mouth daily. PATIENT MUST KEEP UPCOMING APPT. (Patient taking differently: Take 40 mg by mouth at bedtime. PATIENT MUST KEEP UPCOMING APPT.)    No Known Allergies  SOCIAL HISTORY/FAMILY HISTORY   Reviewed in Epic:  Pertinent findings:  Social History   Tobacco Use   Smoking status: Never   Smokeless tobacco: Never  Vaping Use   Vaping Use: Never used  Substance Use Topics   Alcohol use: Yes  Drug use: No   Social History   Social History Narrative   She is a married (remarried) mother of 3 children 2 from her first marriage they're aged 65 and 18. She has a 9 year old son from her second marriage. 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   Is very active and works full-time As well as exercises.   She never smoked. Social alcohol.    OBJCTIVE -PE, EKG, labs   Wt Readings from Last 3 Encounters:  05/21/22 141 lb 12.8 oz (64.3 kg)  05/16/22 141 lb 12.8 oz (64.3 kg)  05/01/22 147 lb 9.6 oz (67 kg)    Physical Exam: BP 122/78   Pulse 68   Ht 5' 4"  (1.626 m)   Wt 147 lb 9.6 oz (67 kg)   SpO2 99%   BMI 25.34 kg/m  Physical Exam Vitals reviewed.  Constitutional:      General: She is not in acute distress.    Appearance: Normal appearance. She is normal weight. She is not ill-appearing.  HENT:     Head: Normocephalic and atraumatic.  Neck:     Vascular: No carotid bruit, hepatojugular reflux or JVD.  Cardiovascular:     Rate and Rhythm: Normal rate and regular rhythm. No  extrasystoles are present.    Chest Wall: PMI is not displaced.     Pulses: Normal pulses.     Heart sounds: Normal heart sounds, S1 normal and S2 normal. No murmur heard.    No friction rub. No gallop.  Pulmonary:     Effort: Pulmonary effort is normal. No respiratory distress.     Breath sounds: No wheezing, rhonchi or rales.  Chest:     Chest wall: No tenderness.  Musculoskeletal:        General: No swelling. Normal range of motion.     Cervical back: Normal range of motion and neck supple.  Skin:    General: Skin is warm and dry.  Neurological:     General: No focal deficit present.     Mental Status: She is alert and oriented to person, place, and time. Mental status is at baseline.     Cranial Nerves: No cranial nerve deficit.     Gait: Gait normal.  Psychiatric:        Mood and Affect: Mood normal.        Behavior: Behavior normal.        Thought Content: Thought content normal.        Judgment: Judgment normal.     Adult ECG Report  Rate: 68 ;  Rhythm: normal sinus rhythm and Normal axis, intervals and durations. ;   Narrative Interpretation: Stable  Recent Labs:  Reviewed  No results found for: "CHOL", "HDL", "LDLCALC", "LDLDIRECT", "TRIG", "CHOLHDL" Lab Results  Component Value Date   CREATININE 0.71 05/16/2022   BUN 15 05/16/2022   NA 138 05/16/2022   K 4.4 05/16/2022   CL 107 05/16/2022   CO2 24 05/16/2022      Latest Ref Rng & Units 05/16/2022    2:31 PM  CBC  WBC 4.0 - 10.5 K/uL 8.1   Hemoglobin 12.0 - 15.0 g/dL 14.4   Hematocrit 36.0 - 46.0 % 42.9   Platelets 150 - 400 K/uL 199   January 2022: TSH 2.4; A1c 5.5; NA 135, K4.1, 98, 23   , BUN 19, CR 1.09, GLU 84; CA 9.7; alk phos 66, AST 29, ALT  28  No results found for: "HGBA1C" No results found for: "TSH"  ==================================================  COVID-19 Education: The signs and symptoms of COVID-19 were discussed with the patient and how to seek care for testing (follow up with PCP or  arrange E-visit).    I spent a total of 47mnutes with the patient spent in direct patient consultation.  Additional time spent with chart review  / charting (studies, outside notes, etc): 11 min Total Time: 31 min  Current medicines are reviewed at length with the patient today.  (+/- concerns) none  This visit occurred during the SARS-CoV-2 public health emergency.  Safety protocols were in place, including screening questions prior to the visit, additional usage of staff PPE, and extensive cleaning of exam room while observing appropriate contact time as indicated for disinfecting solutions.  Notice: This dictation was prepared with Dragon dictation along with smart phrase technology. Any transcriptional errors that result from this process are unintentional and may not be corrected upon review.  Studies Ordered:   Orders Placed This Encounter  Procedures   EKG 12-Lead   Meds ordered this encounter  Medications   lisinopril (ZESTRIL) 2.5 MG tablet    Sig: Take 1 tablet (2.5 mg total) by mouth daily.    Dispense:  90 tablet    Refill:  3    Patient Instructions / Medication Changes & Studies & Tests Ordered   Patient Instructions  Medication Instructions:  No changes  *If you need a refill on your cardiac medications before your next appointment, please call your pharmacy*   Lab Work: Not needed    Testing/Procedures: Not needed   Follow-Up: [facility] you and your health needs are our priority.  As part of our continuing mission to provide you with exceptional heart care, we have created designated Provider Care Teams.  These Care Teams include your primary Cardiologist (physician) and Advanced Practice Providers (APPs -  Physician Assistants and Nurse Practitioners) who all work together to provide you with the care you need, when you need it.     Your next appointment:   18 to 24  month(s)  The format for your next appointment:   In  Person  Provider:   None    Other Instructions      DGlenetta Hew M.D., M.S. Interventional Cardiologist   Pager # [phone] # [phone] [place]  [id number]  [facility]!

## 2022-05-15 NOTE — Progress Notes (Signed)
COVID Vaccine Completed:  Date of COVID positive in last 90 days:  PCP - Lynnell Chad, NP Cardiologist - Bryan Lemma, MD  Chest x-ray -  EKG - 05/01/22 Epic Stress Test - 09/30/06 Epic ECHO - greater than 2 years Cardiac Cath -  Pacemaker/ICD device last checked: Spinal Cord Stimulator:  Bowel Prep -   Sleep Study -  CPAP -   Fasting Blood Sugar -  Checks Blood Sugar _____ times a day  Blood Thinner Instructions: Aspirin Instructions: Last Dose:  Activity level:  Can go up a flight of stairs and perform activities of daily living without stopping and without symptoms of chest pain or shortness of breath.  Able to exercise without symptoms  Unable to go up a flight of stairs without symptoms of     Anesthesia review:   Patient denies shortness of breath, fever, cough and chest pain at PAT appointment  Patient verbalized understanding of instructions that were given to them at the PAT appointment. Patient was also instructed that they will need to review over the PAT instructions again at home before surgery.

## 2022-05-15 NOTE — Patient Instructions (Signed)
DUE TO COVID-19 ONLY TWO VISITORS  (aged 56 and older)  ARE ALLOWED TO COME WITH YOU AND STAY IN THE WAITING ROOM ONLY DURING PRE OP AND PROCEDURE.   **NO VISITORS ARE ALLOWED IN THE SHORT STAY AREA OR RECOVERY ROOM!!**   Your procedure is scheduled on: 05/21/22   Report to [facility] Main Entrance    Report to admitting at 10:45 AM   Call this number if you have problems the morning of surgery [phone]   Do not eat food :After Midnight.   After Midnight you may have the following liquids until 10:00 AM DAY OF SURGERY  Water Black Coffee (sugar ok, NO MILK/CREAM OR CREAMERS)  Tea (sugar ok, NO MILK/CREAM OR CREAMERS) regular and decaf                             Plain Jell-O (NO RED)                                           Fruit ices (not with fruit pulp, NO RED)                                     Popsicles (NO RED)                                                                  Juice: apple, WHITE grape, WHITE cranberry Sports drinks like Gatorade (NO RED) Clear broth(vegetable,chicken,beef)                  The day of surgery:  Drink ONE (1) Pre-Surgery Clear Ensure or G2 at AM the morning of surgery. Drink in one sitting. Do not sip.  This drink was given to you during your hospital  pre-op appointment visit. Nothing else to drink after completing the  Pre-Surgery Clear Ensure or G2.          If you have questions, please contact your surgeon's office.   FOLLOW BOWEL PREP AND ANY ADDITIONAL PRE OP INSTRUCTIONS YOU RECEIVED FROM YOUR SURGEON'S OFFICE!!!     Oral Hygiene is also important to reduce your risk of infection.                                    Remember - BRUSH YOUR TEETH THE MORNING OF SURGERY WITH YOUR REGULAR TOOTHPASTE   Do NOT smoke after Midnight   Take these medicines the morning of surgery with A SIP OF WATER: None  Bring CPAP mask and tubing day of surgery.                              You may not have any metal on your body  including hair pins, jewelry, and body piercing             Do not wear make-up, lotions, powders, perfumes, or deodorant  Do not wear nail  polish including gel and S&S, artificial/acrylic nails, or any other type of covering on natural nails including finger and toenails. If you have artificial nails, gel coating, etc. that needs to be removed by a nail salon please have this removed prior to surgery or surgery may need to be canceled/ delayed if the surgeon/ anesthesia feels like they are unable to be safely monitored.   Do not shave  48 hours prior to surgery.    Do not bring valuables to the hospital. [place].   Contacts, dentures or bridgework may not be worn into surgery.  DO NOT [place]. PHARMACY WILL DISPENSE MEDICATIONS LISTED ON YOUR MEDICATION LIST TO YOU DURING YOUR ADMISSION [place]!    Patients discharged on the day of surgery will not be allowed to drive home.  Someone NEEDS to stay with you for the first 24 hours after anesthesia.   Special Instructions: Bring a copy of your healthcare power of attorney and living will documents         the day of surgery if you haven't scanned them before.              Please read over the following fact sheets you were given: IF YOU HAVE QUESTIONS ABOUT YOUR PRE-OP INSTRUCTIONS PLEASE CALL [place] - Preparing for Surgery Before surgery, you can play an important role.  Because skin is not sterile, your skin needs to be as free of germs as possible.  You can reduce the number of germs on your skin by washing with CHG (chlorahexidine gluconate) soap before surgery.  CHG is an antiseptic cleaner which kills germs and bonds with the skin to continue killing germs even after washing. Please DO NOT use if you have an allergy to CHG or antibacterial soaps.  If your skin becomes reddened/irritated stop using the CHG and inform  your nurse when you arrive at Short Stay. Do not shave (including legs and underarms) for at least 48 hours prior to the first CHG shower.  You may shave your face/neck.  Please follow these instructions carefully:  1.  Shower with CHG Soap the night before surgery and the  morning of surgery.  2.  If you choose to wash your hair, wash your hair first as usual with your normal  shampoo.  3.  After you shampoo, rinse your hair and body thoroughly to remove the shampoo.                             4.  Use CHG as you would any other liquid soap.  You can apply chg directly to the skin and wash.  Gently with a scrungie or clean washcloth.  5.  Apply the CHG Soap to your body ONLY FROM THE NECK DOWN.   Do   not use on face/ open                           Wound or open sores. Avoid contact with eyes, ears mouth and   genitals (private parts).                       Wash face,  Genitals (private parts) with your normal soap.  6.  Wash thoroughly, paying special attention to the area where your    surgery  will be performed.  7.  Thoroughly rinse your body with warm water from the neck down.  8.  DO NOT shower/wash with your normal soap after using and rinsing off the CHG Soap.                9.  Pat yourself dry with a clean towel.            10.  Wear clean pajamas.            11.  Place clean sheets on your bed the night of your first shower and do not  sleep with pets. Day of Surgery : Do not apply any lotions/deodorants the morning of surgery.  Please wear clean clothes to the hospital/surgery center.  FAILURE TO FOLLOW THESE INSTRUCTIONS MAY RESULT IN THE CANCELLATION OF YOUR SURGERY  PATIENT SIGNATURE_________________________________  NURSE SIGNATURE__________________________________  ________________________________________________________________________

## 2022-05-15 NOTE — Progress Notes (Signed)
Please place orders for PAT appointment scheduled 05/16/22

## 2022-05-16 ENCOUNTER — Encounter (HOSPITAL_COMMUNITY): Payer: Self-pay

## 2022-05-16 ENCOUNTER — Encounter (HOSPITAL_COMMUNITY)
Admission: RE | Admit: 2022-05-16 | Discharge: 2022-05-16 | Disposition: A | Discharge status: Home / Self Care | Payer: BC Managed Care – PPO | Source: Ambulatory Visit | Attending: Surgery | Admitting: Surgery

## 2022-05-16 ENCOUNTER — Other Ambulatory Visit: Payer: Self-pay

## 2022-05-16 VITALS — BP 116/76 | HR 76 | Temp 98.6°F | Resp 16 | Ht 64.0 in | Wt 141.8 lb

## 2022-05-16 DIAGNOSIS — Z01812 Encounter for preprocedural laboratory examination: Secondary | ICD-10-CM | POA: Diagnosis present

## 2022-05-16 DIAGNOSIS — I10 Essential (primary) hypertension: Secondary | ICD-10-CM | POA: Insufficient documentation

## 2022-05-16 HISTORY — DX: Nonrheumatic mitral (valve) prolapse: I34.1

## 2022-05-16 LAB — CBC
HCT: 42.9 % (ref 36.0–46.0)
Hemoglobin: 14.4 g/dL (ref 12.0–15.0)
MCH: 31.6 pg (ref 26.0–34.0)
MCHC: 33.6 g/dL (ref 30.0–36.0)
MCV: 94.3 fL (ref 80.0–100.0)
Platelets: 199 10*3/uL (ref 150–400)
RBC: 4.55 MIL/uL (ref 3.87–5.11)
RDW: 11.9 % (ref 11.5–15.5)
WBC: 8.1 10*3/uL (ref 4.0–10.5)
nRBC: 0 % (ref 0.0–0.2)

## 2022-05-16 LAB — BASIC METABOLIC PANEL
Anion gap: 7 (ref 5–15)
BUN: 15 mg/dL (ref 6–20)
CO2: 24 mmol/L (ref 22–32)
Calcium: 9.6 mg/dL (ref 8.9–10.3)
Chloride: 107 mmol/L (ref 98–111)
Creatinine, Ser: 0.71 mg/dL (ref 0.44–1.00)
GFR, Estimated: >60 mL/min (ref >60)
Glucose, Bld: 95 mg/dL (ref 70–99)
Potassium: 4.4 mmol/L (ref 3.5–5.1)
Sodium: 138 mmol/L (ref 135–145)

## 2022-05-16 NOTE — Progress Notes (Signed)
COVID Vaccine Completed:   Date of COVID positive in last 90 days:   PCP - Lynnell Chad, NP, [facility] Cardiologist - Bryan Lemma, MD   Chest x-ray - N/A EKG - 05/01/22 Epic Stress Test - 09/30/06 Epic ECHO - greater than 2 years Cardiac Cath - N/A Pacemaker/ICD device last checked:N/A Spinal Cord Stimulator:N/A   Bowel Prep - N/A   Sleep Study - N/A CPAP - N/A   Fasting Blood Sugar - N/A Checks Blood Sugar __N/A___ times a day   Blood Thinner Instructions:N/A Aspirin Instructions:N/A Last Dose:N/A   Activity level:  Able to exercise without symptoms                        Anesthesia review:    Patient denies shortness of breath, fever, cough and chest pain at PAT appointment   Patient verbalized understanding of instructions that were given to them at the PAT appointment. Patient was also instructed that they will need to review over the PAT instructions again at home before surgery.

## 2022-05-20 NOTE — H&P (Signed)
REFERRING PHYSICIAN: Shon Millet, MD  PROVIDER: Joya San, MD  MRN: [id number] DOB: [birth year]  Chief Complaint: New Consultation (Groin mass)   History of Present Illness: Kathryn Horton is a 56 y.o. female who is seen today as an office consultation at the request of Dr. Gaetano Net for evaluation of New Consultation (Groin mass) .   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 She and her husband are both in this role and will be soon retired. She sees Kathryn Horton and presented to him with a new knot that she found in her left labia. It has not been tender and has not drained anything. He referred her for my evaluation  Review of Systems: See HPI as well for other ROS.  ROS   Medical History: Past Medical History:  Diagnosis Date   Hypertension   There is no problem list on file for this patient.  Past Surgical History:  Procedure Laterality Date   LAPAROSCOPIC TUBAL LIGATION 1992   tubal reversal 2000   APPENDECTOMY    No Known Allergies  Current Outpatient Medications on File Prior to Visit  Medication Sig Dispense Refill   lisinopriL (ZESTRIL) 2.5 MG tablet Take 2.5 mg by mouth once daily   pantoprazole (PROTONIX) 40 MG DR tablet TAKE 1 TABLET BY MOUTH ONCE DAILY. NEED TO KEEP UPCOMING APPOINTMENT   No current facility-administered medications on file prior to visit.   Family History  Problem Relation Age of Onset   Skin cancer Mother   High blood pressure (Hypertension) Mother   Breast cancer Sister    Social History   Tobacco Use  Smoking Status Never  Smokeless Tobacco Never    Social History   Socioeconomic History   Marital status: Unknown  Tobacco Use   Smoking status: Never   Smokeless tobacco: Never  Vaping Use   Vaping Use: Never used  Substance and Sexual Activity   Alcohol use: Yes   Drug use: Never   Objective:   Vitals:  BP: 118/72  Pulse: 73   SpO2: 98%  Weight: 67.8 kg (149 lb 6.4 oz)  Height: 162.6 cm (5\' 4" )   Body mass index is 25.64 kg/m.  Physical Exam General: Well maintained white female no acute distress GU with the patient's assistance and with Chemira assisting we exposed an area of the left proximal labia majora that is that sort of the lateral aspect and it is about 1-1/2 cm in diameter, nontender, slightly rubbery and movable not fixed with some overlying tanning of the skin above it. There is been no history of hidradenitis in this region. I think it is a fibrous nodule of uncertain etiology and I think we would want to excise this under general and placed the specimen for pathologic exam.  Labs, Imaging and Diagnostic Testing: None to review.  Assessment and Plan:  Diagnoses and all orders for this visit:  Fibrous nodule-marked in preop holding    This appears to be a idiopathic nodule of the left perineal region at the margin between the proximal left labia majora and the thigh. It does not appear to be a femoral hernia or a hernia. Plan general anesthesia and excision either at [facility] day surgery or at [facility].  No follow-ups on file.  Kathryn Horton Donia Pounds, MD

## 2022-05-21 ENCOUNTER — Other Ambulatory Visit: Payer: Self-pay

## 2022-05-21 ENCOUNTER — Encounter (HOSPITAL_COMMUNITY): Payer: Self-pay | Admitting: Surgery

## 2022-05-21 ENCOUNTER — Ambulatory Visit (HOSPITAL_COMMUNITY): Payer: BC Managed Care – PPO | Admitting: Anesthesiology

## 2022-05-21 ENCOUNTER — Ambulatory Visit (HOSPITAL_COMMUNITY)
Admission: RE | Admit: 2022-05-21 | Discharge: 2022-05-21 | Disposition: A | Discharge status: Home / Self Care | Payer: BC Managed Care – PPO | Attending: Surgery | Admitting: Surgery

## 2022-05-21 ENCOUNTER — Encounter (HOSPITAL_COMMUNITY)
Admission: RE | Disposition: A | Discharge status: Home / Self Care | Payer: Self-pay | Source: Home / Self Care | Attending: Surgery

## 2022-05-21 DIAGNOSIS — L720 Epidermal cyst: Secondary | ICD-10-CM | POA: Diagnosis not present

## 2022-05-21 DIAGNOSIS — R569 Unspecified convulsions: Secondary | ICD-10-CM | POA: Insufficient documentation

## 2022-05-21 DIAGNOSIS — I10 Essential (primary) hypertension: Secondary | ICD-10-CM | POA: Diagnosis not present

## 2022-05-21 DIAGNOSIS — K219 Gastro-esophageal reflux disease without esophagitis: Secondary | ICD-10-CM | POA: Insufficient documentation

## 2022-05-21 DIAGNOSIS — R1909 Other intra-abdominal and pelvic swelling, mass and lump: Secondary | ICD-10-CM | POA: Diagnosis present

## 2022-05-21 HISTORY — PX: MASS EXCISION: SHX2000

## 2022-05-21 SURGERY — EXCISION MASS
Anesthesia: General | Site: Perineum | Laterality: Left

## 2022-05-21 MED ORDER — HEPARIN SODIUM (PORCINE) 5000 UNIT/ML IJ SOLN
5000.0000 [IU] | Freq: Once | INTRAMUSCULAR | Status: AC
  Administered 2022-05-21: 5000 [IU] via SUBCUTANEOUS
  Filled 2022-05-21: qty 1

## 2022-05-21 MED ORDER — MIDAZOLAM HCL 2 MG/2ML IJ SOLN
INTRAMUSCULAR | Status: AC
  Filled 2022-05-21: qty 2

## 2022-05-21 MED ORDER — DEXAMETHASONE SODIUM PHOSPHATE 10 MG/ML IJ SOLN
INTRAMUSCULAR | Status: AC
  Filled 2022-05-21: qty 1

## 2022-05-21 MED ORDER — CHLORHEXIDINE GLUCONATE CLOTH 2 % EX PADS: 6.0000 | MEDICATED_PAD | Freq: Once | CUTANEOUS | Status: DC

## 2022-05-21 MED ORDER — DEXAMETHASONE SODIUM PHOSPHATE 10 MG/ML IJ SOLN
INTRAMUSCULAR | Status: DC | PRN
  Administered 2022-05-21: 8 mg via INTRAVENOUS

## 2022-05-21 MED ORDER — LACTATED RINGERS IV SOLN: INTRAVENOUS | Status: DC

## 2022-05-21 MED ORDER — CEFAZOLIN SODIUM-DEXTROSE 2-4 GM/100ML-% IV SOLN
2.0000 g | INTRAVENOUS | Status: AC
  Administered 2022-05-21: 2 g via INTRAVENOUS
  Filled 2022-05-21: qty 100

## 2022-05-21 MED ORDER — 0.9 % SODIUM CHLORIDE (POUR BTL) OPTIME
TOPICAL | Status: DC | PRN
  Administered 2022-05-21: 1000 mL

## 2022-05-21 MED ORDER — PROPOFOL 10 MG/ML IV BOLUS
INTRAVENOUS | Status: DC | PRN
  Administered 2022-05-21: 150 mg via INTRAVENOUS

## 2022-05-21 MED ORDER — FENTANYL CITRATE PF 50 MCG/ML IJ SOSY: 25.0000 ug | PREFILLED_SYRINGE | INTRAMUSCULAR | Status: DC | PRN

## 2022-05-21 MED ORDER — ONDANSETRON HCL 4 MG/2ML IJ SOLN
INTRAMUSCULAR | Status: DC | PRN
  Administered 2022-05-21: 4 mg via INTRAVENOUS

## 2022-05-21 MED ORDER — CHLORHEXIDINE GLUCONATE 0.12 % MT SOLN
15.0000 mL | Freq: Once | OROMUCOSAL | Status: AC
  Administered 2022-05-21: 15 mL via OROMUCOSAL

## 2022-05-21 MED ORDER — ACETAMINOPHEN 500 MG PO TABS
1000.0000 mg | ORAL_TABLET | ORAL | Status: AC
  Administered 2022-05-21: 1000 mg via ORAL
  Filled 2022-05-21: qty 2

## 2022-05-21 MED ORDER — LIDOCAINE HCL (CARDIAC) PF 100 MG/5ML IV SOSY
PREFILLED_SYRINGE | INTRAVENOUS | Status: DC | PRN
  Administered 2022-05-21: 100 mg via INTRAVENOUS

## 2022-05-21 MED ORDER — PROPOFOL 10 MG/ML IV BOLUS
INTRAVENOUS | Status: AC
Start: 2022-05-21 — End: ?
  Filled 2022-05-21: qty 20

## 2022-05-21 MED ORDER — ONDANSETRON HCL 4 MG/2ML IJ SOLN
INTRAMUSCULAR | Status: AC
  Filled 2022-05-21: qty 2

## 2022-05-21 MED ORDER — FENTANYL CITRATE (PF) 100 MCG/2ML IJ SOLN
INTRAMUSCULAR | Status: DC | PRN
Start: 2022-05-21 — End: 2022-05-21
  Administered 2022-05-21: 50 ug via INTRAVENOUS

## 2022-05-21 MED ORDER — LIDOCAINE HCL 1 % IJ SOLN
INTRAMUSCULAR | Status: DC | PRN
  Administered 2022-05-21: 11 mL

## 2022-05-21 MED ORDER — MIDAZOLAM HCL 5 MG/5ML IJ SOLN
INTRAMUSCULAR | Status: DC | PRN
  Administered 2022-05-21: 2 mg via INTRAVENOUS

## 2022-05-21 MED ORDER — LIDOCAINE HCL (PF) 1 % IJ SOLN
INTRAMUSCULAR | Status: AC
  Filled 2022-05-21: qty 30

## 2022-05-21 MED ORDER — HYDROCODONE-ACETAMINOPHEN 5-325 MG PO TABS: 1.0000 | ORAL_TABLET | Freq: Four times a day (QID) | ORAL | 0 refills | Status: DC | PRN

## 2022-05-21 MED ORDER — FENTANYL CITRATE (PF) 100 MCG/2ML IJ SOLN
INTRAMUSCULAR | Status: AC
  Filled 2022-05-21: qty 2

## 2022-05-21 MED ORDER — ORAL CARE MOUTH RINSE: 15.0000 mL | Freq: Once | OROMUCOSAL | Status: AC

## 2022-05-21 MED ORDER — LIDOCAINE HCL (PF) 2 % IJ SOLN
INTRAMUSCULAR | Status: AC
  Filled 2022-05-21: qty 5

## 2022-05-21 SURGICAL SUPPLY — 33 items
ADH SKN CLS APL DERMABOND .7 (GAUZE/BANDAGES/DRESSINGS) ×1
APL SKNCLS STERI-STRIP NONHPOA (GAUZE/BANDAGES/DRESSINGS)
BAG COUNTER SPONGE SURGICOUNT (BAG) IMPLANT
BAG SPNG CNTER NS LX DISP (BAG)
BENZOIN TINCTURE PRP APPL 2/3 (GAUZE/BANDAGES/DRESSINGS) IMPLANT
DERMABOND ADVANCED (GAUZE/BANDAGES/DRESSINGS) ×1
DERMABOND ADVANCED .7 DNX12 (GAUZE/BANDAGES/DRESSINGS) IMPLANT
DRAPE LAPAROSCOPIC ABDOMINAL (DRAPES) IMPLANT
DRAPE LAPAROTOMY T 102X78X121 (DRAPES) IMPLANT
DRAPE LAPAROTOMY TRNSV 102X78 (DRAPES) ×2 IMPLANT
DRAPE UTILITY XL STRL (DRAPES) ×2 IMPLANT
ELECT REM PT RETURN 15FT ADLT (MISCELLANEOUS) ×2 IMPLANT
GAUZE PACKING IODOFORM 1/4X15 (PACKING) IMPLANT
GAUZE SPONGE 4X4 12PLY STRL (GAUZE/BANDAGES/DRESSINGS) ×2 IMPLANT
GLOVE SURG LX 8.0 MICRO (GLOVE) ×1
GLOVE SURG LX STRL 8.0 MICRO (GLOVE) ×1 IMPLANT
GOWN STRL REUS W/ TWL XL LVL3 (GOWN DISPOSABLE) ×2 IMPLANT
GOWN STRL REUS W/TWL XL LVL3 (GOWN DISPOSABLE) ×4
KIT BASIN OR (CUSTOM PROCEDURE TRAY) ×2 IMPLANT
KIT TURNOVER KIT A (KITS) IMPLANT
MARKER SKIN DUAL TIP RULER LAB (MISCELLANEOUS) ×2 IMPLANT
NEEDLE HYPO 22GX1.5 SAFETY (NEEDLE) ×2 IMPLANT
PACK GENERAL/GYN (CUSTOM PROCEDURE TRAY) ×2 IMPLANT
PAD TELFA 2X3 NADH STRL (GAUZE/BANDAGES/DRESSINGS) IMPLANT
SPIKE FLUID TRANSFER (MISCELLANEOUS) IMPLANT
SPONGE T-LAP 4X18 ~~LOC~~+RFID (SPONGE) IMPLANT
STAPLER VISISTAT 35W (STAPLE) IMPLANT
SUT MNCRL AB 4-0 PS2 18 (SUTURE) IMPLANT
SUT VIC AB 3-0 SH 27 (SUTURE)
SUT VIC AB 3-0 SH 27XBRD (SUTURE) IMPLANT
SYR 20ML LL LF (SYRINGE) ×2 IMPLANT
TOWEL OR 17X26 10 PK STRL BLUE (TOWEL DISPOSABLE) ×2 IMPLANT
TOWEL OR NON WOVEN STRL DISP B (DISPOSABLE) ×2 IMPLANT

## 2022-05-21 NOTE — Anesthesia Procedure Notes (Signed)
Procedure Name: LMA Insertion Date/Time: 05/21/2022 1:25 PM  Performed by: Lind Covert, CRNAPre-anesthesia Checklist: Patient identified, Emergency Drugs available, Suction available, Timeout performed and Patient being monitored Patient Re-evaluated:Patient Re-evaluated prior to induction Oxygen Delivery Method: Circle system utilized Preoxygenation: Pre-oxygenation with 100% oxygen Induction Type: IV induction Ventilation: Mask ventilation without difficulty LMA: LMA inserted LMA Size: 4.0 Tube type: Oral Number of attempts: 1 Placement Confirmation: positive ETCO2 and breath sounds checked- equal and bilateral Tube secured with: Tape Dental Injury: Teeth and Oropharynx as per pre-operative assessment

## 2022-05-21 NOTE — Interval H&P Note (Signed)
History and Physical Interval Note:  05/21/2022 12:59 PM  Kathryn Horton  has presented today for surgery, with the diagnosis of nodule of left labia.  The various methods of treatment have been discussed with the patient and family. After consideration of risks, benefits and other options for treatment, the patient has consented to  Procedure(s): EXCISION MASS LEFT LABIA (Left) as a surgical intervention.  The patient's history has been reviewed, patient examined, no change in status, stable for surgery.  I have reviewed the patient's chart and labs.  Questions were answered to the patient's satisfaction.     Valarie Merino

## 2022-05-21 NOTE — Transfer of Care (Signed)
Immediate Anesthesia Transfer of Care Note  Patient: Kathryn Horton  Procedure(s) Performed: EXCISION MASS LEFT LABIA (Left: Perineum)  Patient Location: PACU  Anesthesia Type:General  Level of Consciousness: sedated  Airway & Oxygen Therapy: Patient Spontanous Breathing and Patient connected to face mask oxygen  Post-op Assessment: Report given to RN and Post -op Vital signs reviewed and stable  Post vital signs: Reviewed and stable  Last Vitals:  Vitals Value Taken Time  BP    Temp    Pulse 64 05/21/22 1413  Resp 15 05/21/22 1413  SpO2 100 % 05/21/22 1413  Vitals shown include unvalidated device data.  Last Pain:  Vitals:   05/21/22 1108  TempSrc: Oral         Complications: No notable events documented.

## 2022-05-21 NOTE — Op Note (Signed)
Kathryn Horton  07/14/1966   05/21/2022    PCP:  Everlene Farrier, MD   Surgeon: Kaylyn Lim, MD, FACS  Asst:  none  Anes:  general  Preop Dx: Mass in upper left labia Postop Dx: same  Procedure: Excision of left labial cyst Location Surgery: [facility] 2 Complications:  none  EBL:   minimal cc  Drains: none  Description of Procedure:  The patient was taken to OR 2 .  After anesthesia was administered and the patient was prepped  with a Hibiclens solution.  The mass had been localized in the holding area with her help and it was about a centimeter long and almost a centimeter wide.  It is at the superior labia.  For first injected this area to see if I could find a definite punctum.  I did see a small punctum with extruding some sebum.  I use that as a guide to excise an ellipse and read retrieving that and then going into the subcutaneous fat and then down deep.  The cyst did not have solid material in it but would rather was without liquid cereal which I resected the sac and the little cyst and sent that all is 1 piece.  There is still somewhat of the mass effect and I went through the below her lower scar and just encountered some fatty tissue.  The area was injected again with more local lidocaine plain and then closed with inverted simple sutures of 4-0 Monocryl and Dermabond on the skin.  The specimen was sent for permanent sections.  The patient tolerated the procedure well and was taken to the PACU in stable condition.     Matt B. Hassell Done, [place], [facility], [place]

## 2022-05-21 NOTE — Discharge Instructions (Signed)
May shower tomorrow Take pain meds if necessary

## 2022-05-21 NOTE — Anesthesia Preprocedure Evaluation (Signed)
Anesthesia Evaluation  Patient identified by MRN, date of birth, ID band Patient awake    Reviewed: Allergy & Precautions, NPO status   Airway Mallampati: II  TM Distance: >3 FB     Dental   Pulmonary neg pulmonary ROS,    breath sounds clear to auscultation       Cardiovascular hypertension,  Rhythm:Regular Rate:Normal     Neuro/Psych Seizures -,     GI/Hepatic Neg liver ROS, GERD  ,  Endo/Other  negative endocrine ROS  Renal/GU negative Renal ROS     Musculoskeletal   Abdominal   Peds  Hematology   Anesthesia Other Findings   Reproductive/Obstetrics                             Anesthesia Physical Anesthesia Plan  ASA: 3  Anesthesia Plan: General   Post-op Pain Management:    Induction: Intravenous  PONV Risk Score and Plan: 3 and Ondansetron, Dexamethasone and Midazolam  Airway Management Planned: LMA  Additional Equipment:   Intra-op Plan:   Post-operative Plan: Extubation in OR  Informed Consent: I have reviewed the patients History and Physical, chart, labs and discussed the procedure including the risks, benefits and alternatives for the proposed anesthesia with the patient or authorized representative who has indicated his/her understanding and acceptance.     Dental advisory given  Plan Discussed with: CRNA and Anesthesiologist  Anesthesia Plan Comments:         Anesthesia Quick Evaluation

## 2022-05-21 NOTE — Anesthesia Postprocedure Evaluation (Signed)
Anesthesia Post Note  Patient: Kathryn Horton  Procedure(s) Performed: EXCISION MASS LEFT LABIA (Left: Perineum)     Patient location during evaluation: PACU Anesthesia Type: General Level of consciousness: awake Pain management: pain level controlled Vital Signs Assessment: post-procedure vital signs reviewed and stable Respiratory status: spontaneous breathing Cardiovascular status: stable Postop Assessment: no apparent nausea or vomiting Anesthetic complications: no   No notable events documented.  Last Vitals:  Vitals:   05/21/22 1445 05/21/22 1452  BP: 119/80 122/89  Pulse: 75 76  Resp: 15 20  Temp: 36.5 C 36.4 C  SpO2: 100% 98%    Last Pain:  Vitals:   05/21/22 1452  TempSrc: Oral  PainSc: 0-No pain                 Fenix Rorke

## 2022-05-22 ENCOUNTER — Encounter (HOSPITAL_COMMUNITY): Payer: Self-pay | Admitting: Surgery

## 2022-05-22 LAB — SURGICAL PATHOLOGY

## 2022-06-01 ENCOUNTER — Other Ambulatory Visit: Payer: Self-pay | Admitting: Cardiology

## 2022-06-10 ENCOUNTER — Encounter: Payer: Self-pay | Admitting: Cardiology

## 2022-06-10 DIAGNOSIS — Z01818 Encounter for other preprocedural examination: Secondary | ICD-10-CM | POA: Insufficient documentation

## 2022-06-10 NOTE — Assessment & Plan Note (Signed)
No longer bothersome to her.  Hold off on beta-blocker

## 2022-06-10 NOTE — Assessment & Plan Note (Signed)
BP looks well controlled on current dose of lisinopril.  Due for refill. No changes. Continue low-sodium diet.  Hope to lose weight.

## 2022-06-10 NOTE — Assessment & Plan Note (Signed)
Fully resolved.  EF back to normal.  No CHF Remains on low-dose ACE inhibitor for BP control.

## 2022-07-30 ENCOUNTER — Other Ambulatory Visit: Payer: Self-pay | Admitting: Obstetrics and Gynecology

## 2022-07-30 DIAGNOSIS — Z1231 Encounter for screening mammogram for malignant neoplasm of breast: Secondary | ICD-10-CM

## 2022-08-16 ENCOUNTER — Ambulatory Visit
Admission: RE | Admit: 2022-08-16 | Discharge: 2022-08-16 | Disposition: A | Discharge status: Home / Self Care | Payer: BC Managed Care – PPO | Source: Ambulatory Visit | Attending: Obstetrics and Gynecology | Admitting: Obstetrics and Gynecology

## 2022-08-16 DIAGNOSIS — Z1231 Encounter for screening mammogram for malignant neoplasm of breast: Secondary | ICD-10-CM

## 2022-11-05 ENCOUNTER — Ambulatory Visit: Payer: BC Managed Care – PPO | Admitting: Family Medicine

## 2022-11-05 ENCOUNTER — Encounter: Payer: Self-pay | Admitting: Family Medicine

## 2022-11-05 VITALS — BP 121/76 | HR 86 | Temp 98.4°F | Ht 64.0 in | Wt 145.0 lb

## 2022-11-05 DIAGNOSIS — J029 Acute pharyngitis, unspecified: Secondary | ICD-10-CM | POA: Diagnosis not present

## 2022-11-05 LAB — CULTURE, GROUP A STREP

## 2022-11-05 LAB — RAPID STREP SCREEN (MED CTR MEBANE ONLY): Strep Gp A Ag, IA W/Reflex: NEGATIVE

## 2022-11-05 LAB — VERITOR FLU A/B WAIVED
Influenza A: NEGATIVE
Influenza B: NEGATIVE

## 2022-11-05 MED ORDER — FLUTICASONE PROPIONATE 50 MCG/ACT NA SUSP: 1.0000 | Freq: Two times a day (BID) | NASAL | 6 refills | Status: AC | PRN

## 2022-11-05 NOTE — Progress Notes (Signed)
BP 121/76   Pulse 86   Temp 98.4 F (36.9 C)   Ht 5\' 4"  (1.626 m)   Wt 145 lb (65.8 kg)   SpO2 98%   BMI 24.89 kg/m    Subjective:   Patient ID: Kathryn , female    DOB: [birth year], 56 y.o.   MRN: [id number]  HPI: Kathryn Horton is a 56 y.o. female presenting on 11/05/2022 for Sore Throat and Cough (/)   HPI Sore throat and cough Patient is coming in with sore throat and cough and congestion and fatigue that all started about 4 days ago.  She has had a lot of mucus and congestion and a deep cough.  She is using some Mucinex and Advil and it is helping some.  She denies any fevers or chills.  She did call out of work today.  She has been feeling very fatigued as well.  She does work in 11/07/2022 so she does not know what she has been exposed to but it could be many things.  Relevant past medical, surgical, family and social history reviewed and updated as indicated. Interim medical history since our last visit reviewed. Allergies and medications reviewed and updated.  Review of Systems  Constitutional:  Negative for chills and fever.  HENT:  Positive for congestion, postnasal drip, rhinorrhea, sinus pressure and sore throat. Negative for ear discharge, ear pain and sneezing.   Eyes:  Negative for pain, redness and visual disturbance.  Respiratory:  Positive for cough. Negative for chest tightness and shortness of breath.   Cardiovascular:  Negative for chest pain and leg swelling.  Genitourinary:  Negative for difficulty urinating and dysuria.  Musculoskeletal:  Negative for back pain and gait problem.  Skin:  Negative for rash.  Neurological:  Negative for light-headedness and headaches.  Psychiatric/Behavioral:  Negative for agitation and behavioral problems.   All other systems reviewed and are negative.   Per HPI unless specifically indicated above   Allergies as of 11/05/2022   No Known Allergies      Medication List         Accurate as of November 05, 2022  3:47 PM. If you have any questions, ask your nurse or doctor.          STOP taking these medications    CombiPatch 0.05-0.25 MG/DAY Generic drug: estradiol-norethindrone Stopped by: 08-18-1997, MD   HYDROcodone-acetaminophen 5-325 MG tablet Commonly known as: NORCO/VICODIN Stopped by: Nils Pyle Tessi Eustache, MD       TAKE these medications    cholecalciferol 25 MCG (1000 UNIT) tablet Commonly known as: VITAMIN D3 Take 1,000 Units by mouth in the morning.   fluticasone 50 MCG/ACT nasal spray Commonly known as: FLONASE Place 1 spray into both nostrils 2 (two) times daily as needed for allergies or rhinitis. Started by: Elige Radon Marquest Gunkel, MD   lisinopril 2.5 MG tablet Commonly known as: ZESTRIL Take 1 tablet (2.5 mg total) by mouth daily. What changed: when to take this   pantoprazole 40 MG tablet Commonly known as: PROTONIX TAKE 1 TABLET BY MOUTH ONCE DAILY. NEED TO KEEP UPCOMING APPOINTMENT         Objective:   BP 121/76   Pulse 86   Temp 98.4 F (36.9 C)   Ht 5\' 4"  (1.626 m)   Wt 145 lb (65.8 kg)   SpO2 98%   BMI 24.89 kg/m   Wt Readings from Last 3 Encounters:  11/05/22 145 lb (65.8 kg)  05/21/22 141 lb 12.8 oz (64.3 kg)  05/16/22 141 lb 12.8 oz (64.3 kg)    Physical Exam Vitals reviewed.  Constitutional:      General: She is not in acute distress.    Appearance: She is well-developed. She is not diaphoretic.  HENT:     Right Ear: Tympanic membrane, ear canal and external ear normal.     Left Ear: Tympanic membrane, ear canal and external ear normal.     Nose: Mucosal edema present. No rhinorrhea.     Right Sinus: No maxillary sinus tenderness or frontal sinus tenderness.     Left Sinus: No maxillary sinus tenderness or frontal sinus tenderness.     Mouth/Throat:     Pharynx: Uvula midline. Posterior oropharyngeal erythema present. No oropharyngeal exudate.     Tonsils: No tonsillar abscesses.  Eyes:      Conjunctiva/sclera: Conjunctivae normal.  Cardiovascular:     Rate and Rhythm: Normal rate and regular rhythm.     Heart sounds: Normal heart sounds. No murmur heard. Pulmonary:     Effort: Pulmonary effort is normal. No respiratory distress.     Breath sounds: Normal breath sounds. No wheezing.  Musculoskeletal:        General: No tenderness. Normal range of motion.  Skin:    General: Skin is warm and dry.     Findings: No rash.  Neurological:     Mental Status: She is alert and oriented to person, place, and time.     Coordination: Coordination normal.  Psychiatric:        Behavior: Behavior normal.     Flu and strep were negative, COVID is pending  Assessment & Plan:   Problem List Items Addressed This Visit   None Visit Diagnoses     Pharyngitis, unspecified etiology    -  Primary   Relevant Medications   fluticasone (FLONASE) 50 MCG/ACT nasal spray   Other Relevant Orders   Veritor Flu A/B Waived   Rapid Strep Screen ([facility] ONLY)   Novel Coronavirus, NAA ([facility])     Treat conservatively with the Mucinex and Advil and Flonase  Will let her know on the COVID test.  Follow up plan: Return if symptoms worsen or fail to improve.  Counseling provided for all of the vaccine components Orders Placed This Encounter  Procedures   Rapid Strep Screen ([facility] ONLY)   Novel Coronavirus, NAA ([facility])   Veritor Flu A/B Waived    Arville Care, MD [facility] 11/05/2022, 3:47 PM

## 2022-11-06 LAB — NOVEL CORONAVIRUS, NAA: SARS-CoV-2, NAA: NOT DETECTED

## 2022-11-07 ENCOUNTER — Telehealth: Payer: Self-pay | Admitting: Obstetrics and Gynecology

## 2022-11-07 DIAGNOSIS — H1031 Unspecified acute conjunctivitis, right eye: Secondary | ICD-10-CM

## 2022-11-07 MED ORDER — NEOMYCIN-POLYMYXIN-HC 3.5-10000-1 OP SUSP: 3.0000 [drp] | Freq: Four times a day (QID) | OPHTHALMIC | 0 refills | Status: AC

## 2022-11-07 NOTE — Telephone Encounter (Signed)
Sent in some eyedrops for her that she can use.  Have her continue with Mucinex and use over-the-counter Robitussin.  Sometimes the cough alone can persist as long as the other symptoms are improving.

## 2022-11-07 NOTE — Telephone Encounter (Signed)
Pt made aware and understood. She thinks that she may have pink eye and would like a note to be excused from work for 11/08/22. Will verify with [place] if ok and pt will print note from mychart. Call pt if work note cannot be provided.

## 2022-11-07 NOTE — Telephone Encounter (Signed)
  Incoming Patient Call  11/07/2022  What symptoms do you have? RT eye swollen red burning and pt says she still has a cough  How long have you been sick? Pt seen 11/05/2022  Have you been seen for this problem? 11/05/2022  If your provider decides to give you a prescription, which pharmacy would you like for it to be sent to? [facility].   Patient informed that this information will be sent to the clinical staff for review and that they should receive a follow up call.

## 2022-11-08 NOTE — Telephone Encounter (Signed)
Yes that is fine to go ahead and do the note for her for work.  The drops I sent for her are treatment for pinkeye

## 2022-11-08 NOTE — Telephone Encounter (Signed)
Letter completed. Pt will print from mychart. Aware that eye drops will treat pink eye.

## 2022-11-09 ENCOUNTER — Telehealth: Payer: Self-pay | Admitting: Nurse Practitioner

## 2022-11-09 NOTE — Telephone Encounter (Signed)
Pt was not able to get the eye drops till tomorrow afternoon for pink eye so she will need a letter covering her till 11/12/22. Letter written and sent to pt via MyChart and pt is aware.

## 2022-11-09 NOTE — Telephone Encounter (Signed)
Patient already had message in.

## 2022-11-09 NOTE — Telephone Encounter (Signed)
Patient calls in and states she hadn't got the letter for work for 11-30 and 12-1 in her MyChart. Please call.

## 2023-06-01 ENCOUNTER — Other Ambulatory Visit: Payer: Self-pay | Admitting: Cardiology

## 2023-06-17 IMAGING — MG MM DIGITAL SCREENING BILAT W/ TOMO AND CAD
8 series · 8 of 24 positions shown · non-contrast
Comparison: Previous exam(s).

CLINICAL DATA: Screening.

EXAM:
DIGITAL SCREENING BILATERAL MAMMOGRAM WITH TOMOSYNTHESIS AND CAD
TECHNIQUE: Bilateral screening digital craniocaudal and mediolateral oblique
mammograms were obtained. Bilateral screening digital breast
tomosynthesis was performed. The images were evaluated with
computer-aided detection.

[R MLO synth-2D]
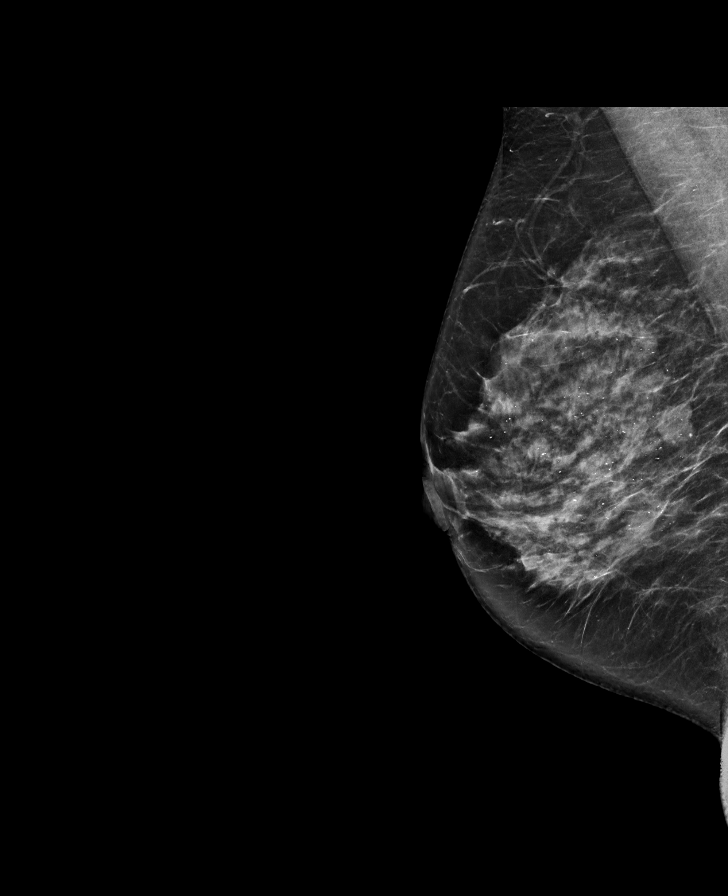

[R CC synth-2D]
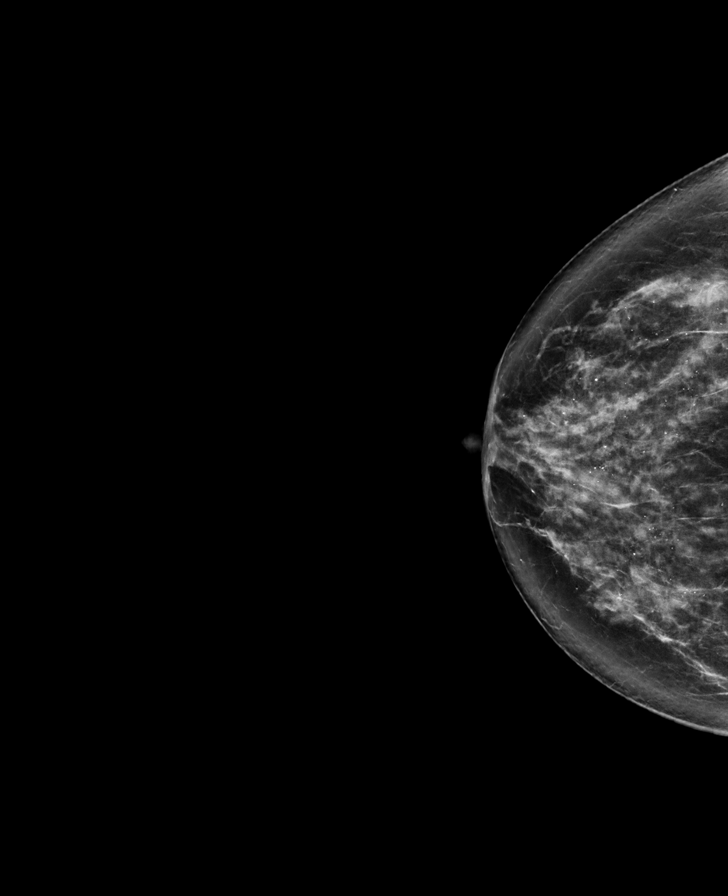

[L MLO synth-2D]
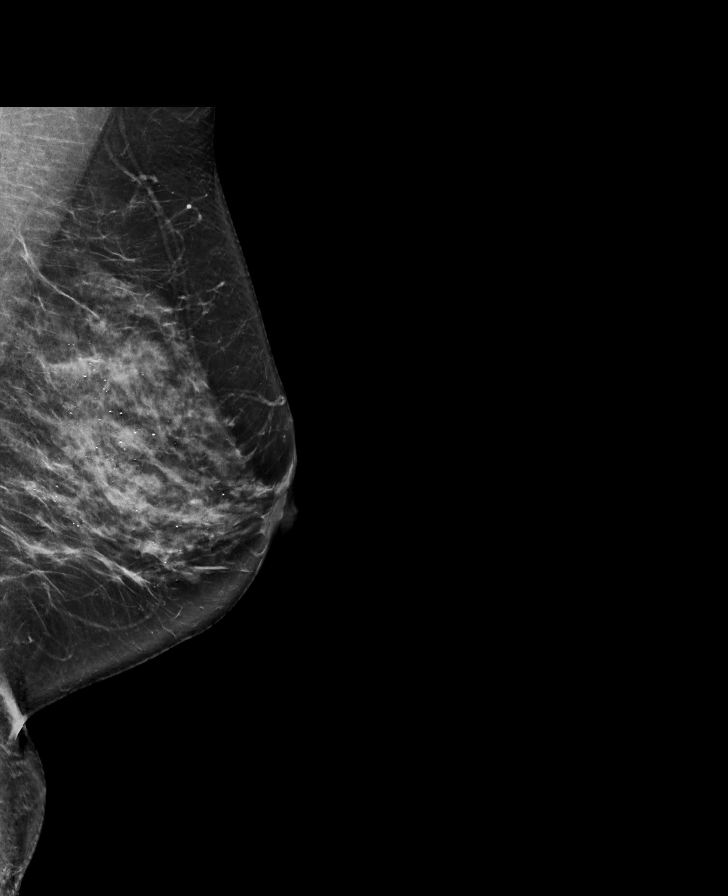

[L CC synth-2D]
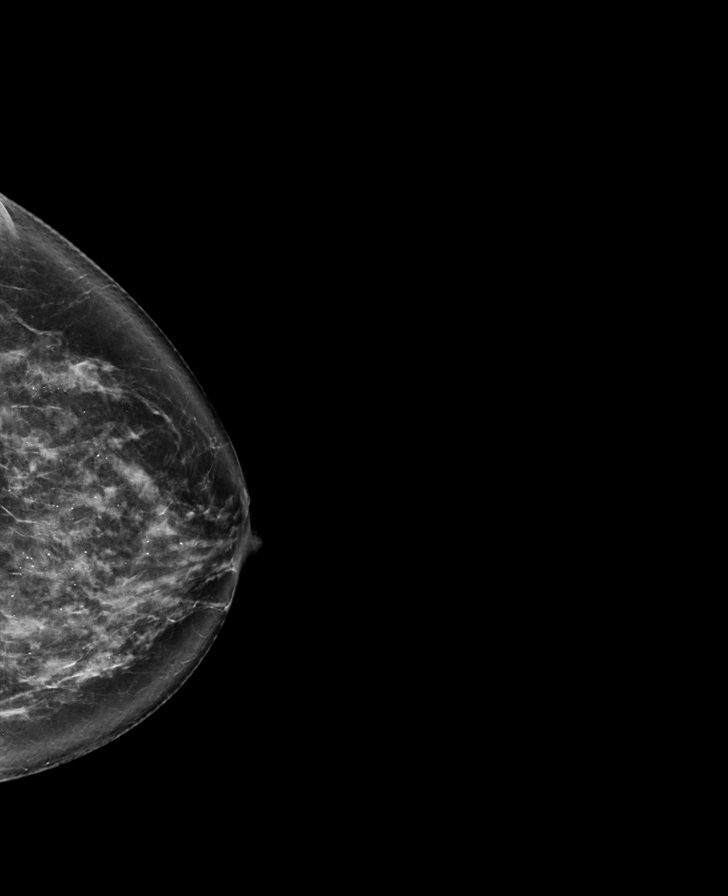

[L CC tomo · tomo slice 41/80.0]
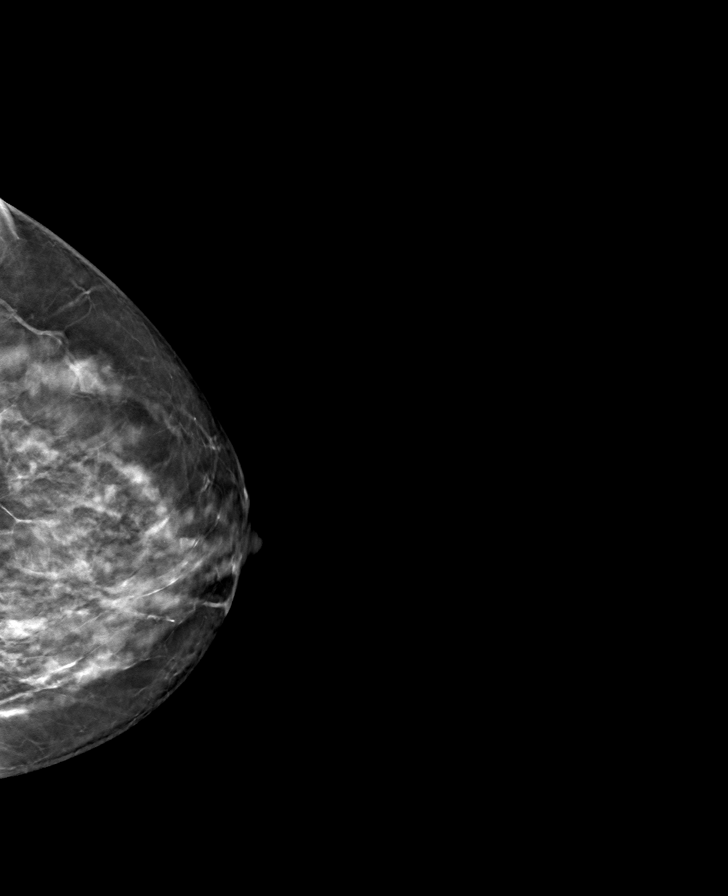

[L MLO tomo · tomo slice 39/78.0]
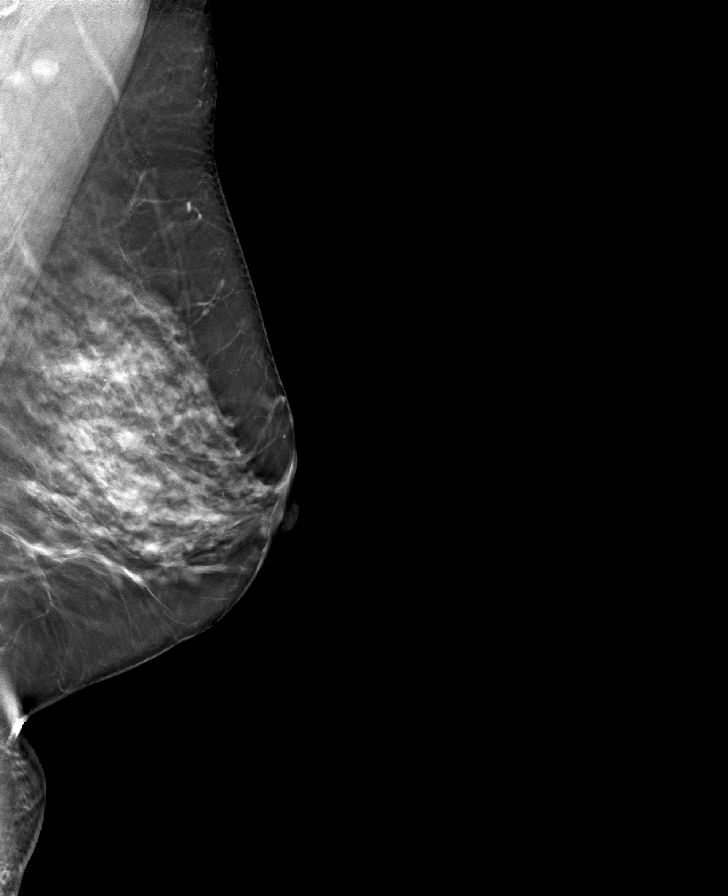

[R CC tomo · tomo slice 43/84.0]
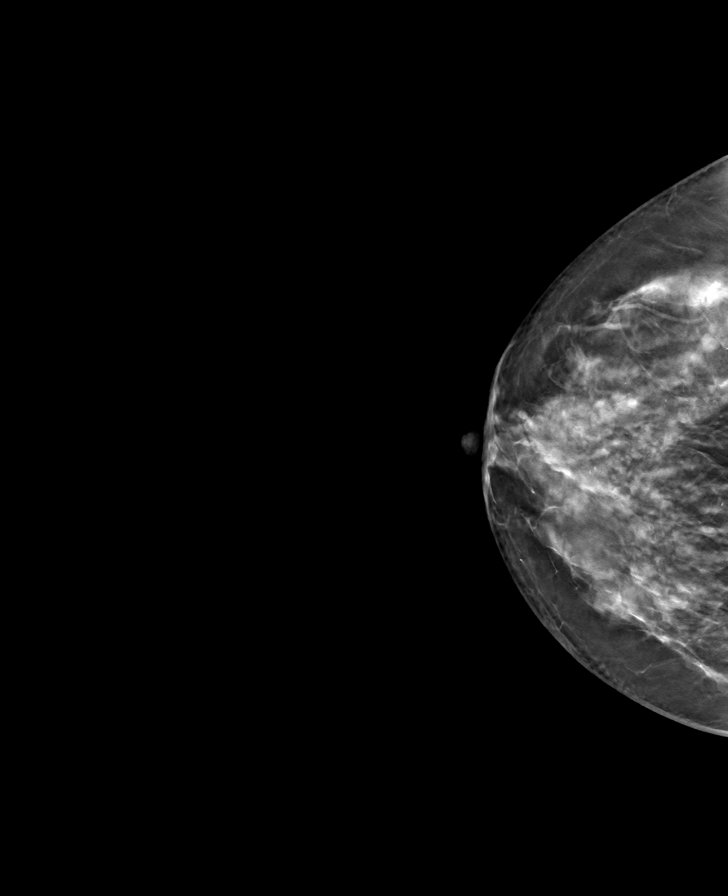

[R MLO tomo · tomo slice 41/80.0]
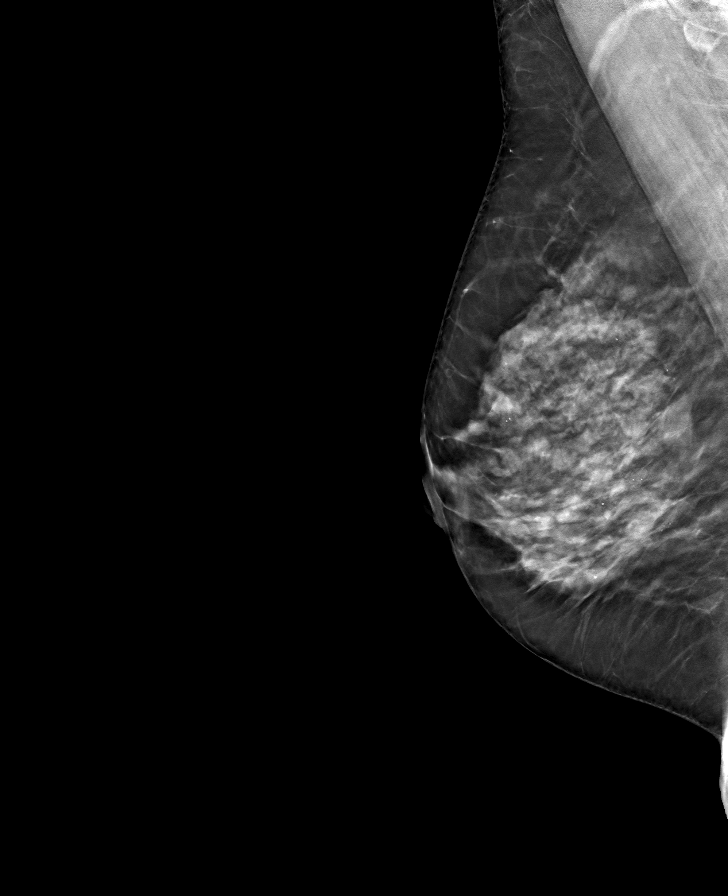

[8 of 24 positions shown; findings below may reference images not displayed]

ACR Breast Density Category d: The breast tissue is extremely dense,
which lowers the sensitivity of mammography
FINDINGS: There are no findings suspicious for malignancy.
IMPRESSION: No mammographic evidence of malignancy. A result letter of this
screening mammogram will be mailed directly to the patient.

RECOMMENDATION:
Screening mammogram in one year. (Code:TA-V-WV9)

BI-RADS CATEGORY  1: Negative.

## 2023-06-29 ENCOUNTER — Other Ambulatory Visit: Payer: Self-pay | Admitting: Cardiology

## 2023-08-27 ENCOUNTER — Other Ambulatory Visit: Payer: Self-pay | Admitting: Cardiology

## 2023-09-23 ENCOUNTER — Other Ambulatory Visit: Payer: Self-pay | Admitting: Obstetrics and Gynecology

## 2023-09-23 DIAGNOSIS — Z1231 Encounter for screening mammogram for malignant neoplasm of breast: Secondary | ICD-10-CM

## 2023-09-26 ENCOUNTER — Other Ambulatory Visit: Payer: Self-pay | Admitting: Cardiology

## 2023-10-02 ENCOUNTER — Ambulatory Visit
Admission: RE | Admit: 2023-10-02 | Discharge: 2023-10-02 | Disposition: A | Discharge status: Home / Self Care | Payer: BC Managed Care – PPO | Source: Ambulatory Visit | Attending: Obstetrics and Gynecology | Admitting: Obstetrics and Gynecology

## 2023-10-02 DIAGNOSIS — Z1231 Encounter for screening mammogram for malignant neoplasm of breast: Secondary | ICD-10-CM

## 2023-10-28 NOTE — Progress Notes (Deleted)
  Cardiology Office Note   Date:  10/28/2023  ID:  Nota, Bew 1966-11-21, MRN [id number] PCP:  Daryll Drown, NP (Inactive) [facility] Cardiologist: Bryan Lemma, MD  Reason for visit: 61-month follow-up  History of Present Illness    Kathryn Horton is a 57 y.o. 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  Patient last saw Dr. Herbie Baltimore in May 2023.  She was exercising 3 to 4 miles a day most days of the week.  No cardiac symptoms.  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  Recommend DASH diet (high in vegetables, fruits, low-fat dairy products, whole grains, poultry, fish, and nuts and low in sweets, sugar-sweetened beverages, and red meats), salt restriction and increase physical activity.  Palpitations -***  Hyperlipidemia -*** -Recommend cholesterol lowering diets - Mediterranean diet, DASH diet, vegetarian diet, low-carbohydrate diet and avoidance of trans fats.  Discussed healthier choice substitutes.  Nuts, high-fiber foods, and fiber supplements may also improve lipids.    Obesity -Even a 5-10% weight loss can have cardiovascular benefits.   -Recommend moderate intensity activity for 30 minutes 5 days/week and the DASH diet.  Tobacco use  -Recommend tobacco cessation.  Reviewed physiologic effects of nicotine and the immediate-eventual benefits of quitting including improvement in cough/breathing and reduction in cardiovascular events.  Discussed quitting tips such as removing triggers and getting support from family/friends and [employer]. -USPSTF recommends one-time screening for abdominal aortic aneurysm (AAA) by ultrasound in men 68 -47 years old who have ever smoked.      Disposition - Follow-up in ***     Objective /  Physical Exam   EKG today: ***  Vital signs:  There were no vitals taken for this visit.    GEN: No acute distress NECK: No carotid bruits CARDIAC: ***RRR, no murmurs RESPIRATORY:  Clear to auscultation without rales, wheezing or rhonchi  EXTREMITIES: No edema  Assessment and Plan   ***   {Are you ordering a CV Procedure (e.g. stress test, cath, DCCV, TEE, etc)?   Press F2        :[id number]}    Signed, Bernette Mayers  10/28/2023 [facility]

## 2023-10-30 ENCOUNTER — Telehealth: Payer: Self-pay | Admitting: Cardiology

## 2023-10-30 ENCOUNTER — Ambulatory Visit: Payer: BC Managed Care – PPO | Admitting: Physician Assistant

## 2023-10-30 DIAGNOSIS — R002 Palpitations: Secondary | ICD-10-CM

## 2023-10-30 DIAGNOSIS — O903 Peripartum cardiomyopathy: Secondary | ICD-10-CM

## 2023-10-30 DIAGNOSIS — I10 Essential (primary) hypertension: Secondary | ICD-10-CM

## 2023-10-30 NOTE — Telephone Encounter (Signed)
I don't know anyone in that area -- best COA is to establish PCP & ask them for recommended Cardiologist referral.   Once she is their, we can send chart info to new cardiologist.  Sorry to see her go  - but happy for her moving to a new area.  DH

## 2023-10-30 NOTE — Telephone Encounter (Signed)
Patient stated she has moved out of town and wants to get a referral from Dr. Herbie Baltimore to a cardiologist in the [facility] [place] area.

## 2023-10-30 NOTE — Telephone Encounter (Signed)
 Attempted to call patient, no answer left message requesting a call back.

## 2023-11-01 NOTE — Telephone Encounter (Addendum)
Attempted to call patient, no answer left message left detailed message relaying provider message below, advised patient to return call if she has any further questions.

## 2023-11-01 NOTE — Telephone Encounter (Signed)
Pt returning nurses call from 11/20. Please advise

## 2023-12-09 ENCOUNTER — Telehealth: Payer: Self-pay | Admitting: Cardiology

## 2023-12-09 MED ORDER — LISINOPRIL 2.5 MG PO TABS: 2.5000 mg | ORAL_TABLET | Freq: Every day | ORAL | 0 refills | Status: AC

## 2023-12-09 NOTE — Telephone Encounter (Signed)
RX sent to new Pharmacy. Dr. Elissa Hefty Pt. F/U is scheduled for 18-24 months.

## 2023-12-09 NOTE — Telephone Encounter (Signed)
*  STAT* If patient is at the pharmacy, call can be transferred to refill team.   1. Which medications need to be refilled? (please list name of each medication and dose if known) lisinopril (ZESTRIL) 2.5 MG tablet    4. Which pharmacy/location (including street and city if local pharmacy) is medication to be sent to? [facility] [address], [place] - [address] Phone: [phone]  Fax: [phone]       5. Do they need a 30 day or 90 day supply? 30    Pt called in called stating she has moved to [facility]. She has an appt with new Cardiologist 01/01/24 but she needs a month supply to last until she is seen there. Please advise if this can be sent to new pharmacy.
